# Patient Record
Sex: Female | Born: 1960
Health system: Southern US, Community
[De-identification: ages and names within clinical notes are randomized; demographics above are authoritative.]

## PROBLEM LIST (undated history)

## (undated) DIAGNOSIS — T8859XA Other complications of anesthesia, initial encounter: Secondary | ICD-10-CM

## (undated) DIAGNOSIS — R002 Palpitations: Secondary | ICD-10-CM

## (undated) DIAGNOSIS — R7303 Prediabetes: Secondary | ICD-10-CM

## (undated) DIAGNOSIS — K219 Gastro-esophageal reflux disease without esophagitis: Secondary | ICD-10-CM

## (undated) DIAGNOSIS — Z72821 Inadequate sleep hygiene: Secondary | ICD-10-CM

## (undated) DIAGNOSIS — T7840XA Allergy, unspecified, initial encounter: Secondary | ICD-10-CM

## (undated) DIAGNOSIS — I739 Peripheral vascular disease, unspecified: Secondary | ICD-10-CM

## (undated) DIAGNOSIS — I10 Essential (primary) hypertension: Secondary | ICD-10-CM

## (undated) HISTORY — DX: Peripheral vascular disease, unspecified: I73.9

## (undated) HISTORY — DX: Palpitations: R00.2

## (undated) HISTORY — PX: DENTAL SURGERY: SHX609

## (undated) HISTORY — DX: Inadequate sleep hygiene: Z72.821

## (undated) HISTORY — DX: Gastro-esophageal reflux disease without esophagitis: K21.9

## (undated) HISTORY — DX: Allergy, unspecified, initial encounter: T78.40XA

## (undated) HISTORY — DX: Essential (primary) hypertension: I10

## (undated) HISTORY — PX: EYE SURGERY: SHX253

---
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
## 1998-01-01 ENCOUNTER — Other Ambulatory Visit: Admission: RE | Admit: 1998-01-01 | Discharge: 1998-01-01 | Payer: Self-pay | Admitting: *Deleted

## 1999-08-11 ENCOUNTER — Other Ambulatory Visit: Admission: RE | Admit: 1999-08-11 | Discharge: 1999-08-11 | Payer: Self-pay | Admitting: *Deleted

## 2000-10-24 ENCOUNTER — Other Ambulatory Visit: Admission: RE | Admit: 2000-10-24 | Discharge: 2000-10-24 | Payer: Self-pay | Admitting: *Deleted

## 2001-09-25 ENCOUNTER — Emergency Department (HOSPITAL_COMMUNITY): Admission: EM | Admit: 2001-09-25 | Discharge: 2001-09-25 | Payer: Self-pay

## 2001-11-01 ENCOUNTER — Other Ambulatory Visit: Admission: RE | Admit: 2001-11-01 | Discharge: 2001-11-01 | Payer: Self-pay | Admitting: *Deleted

## 2003-01-10 ENCOUNTER — Other Ambulatory Visit: Admission: RE | Admit: 2003-01-10 | Discharge: 2003-01-10 | Payer: Self-pay | Admitting: *Deleted

## 2003-06-03 ENCOUNTER — Ambulatory Visit (HOSPITAL_COMMUNITY): Admission: RE | Admit: 2003-06-03 | Discharge: 2003-06-03 | Payer: Self-pay | Admitting: Internal Medicine

## 2004-04-28 ENCOUNTER — Other Ambulatory Visit: Admission: RE | Admit: 2004-04-28 | Discharge: 2004-04-28 | Payer: Self-pay | Admitting: Obstetrics & Gynecology

## 2007-05-02 ENCOUNTER — Ambulatory Visit (HOSPITAL_COMMUNITY): Admission: AD | Admit: 2007-05-02 | Discharge: 2007-05-02 | Payer: Self-pay | Admitting: Internal Medicine

## 2010-10-01 ENCOUNTER — Other Ambulatory Visit (HOSPITAL_COMMUNITY): Payer: Self-pay | Admitting: Internal Medicine

## 2010-10-01 DIAGNOSIS — Z1231 Encounter for screening mammogram for malignant neoplasm of breast: Secondary | ICD-10-CM

## 2010-10-01 DIAGNOSIS — N951 Menopausal and female climacteric states: Secondary | ICD-10-CM

## 2010-10-16 ENCOUNTER — Other Ambulatory Visit (HOSPITAL_COMMUNITY): Payer: Self-pay

## 2010-10-16 ENCOUNTER — Ambulatory Visit (HOSPITAL_COMMUNITY): Payer: Private Health Insurance - Indemnity

## 2010-11-06 ENCOUNTER — Ambulatory Visit (HOSPITAL_COMMUNITY)
Admission: RE | Admit: 2010-11-06 | Discharge: 2010-11-06 | Disposition: A | Discharge status: Home / Self Care | Payer: Private Health Insurance - Indemnity | Source: Ambulatory Visit | Attending: Internal Medicine | Admitting: Internal Medicine

## 2010-11-06 DIAGNOSIS — N951 Menopausal and female climacteric states: Secondary | ICD-10-CM

## 2010-11-06 DIAGNOSIS — Z1231 Encounter for screening mammogram for malignant neoplasm of breast: Secondary | ICD-10-CM

## 2010-11-11 ENCOUNTER — Other Ambulatory Visit (HOSPITAL_COMMUNITY): Payer: Self-pay | Admitting: Internal Medicine

## 2010-11-11 DIAGNOSIS — N6489 Other specified disorders of breast: Secondary | ICD-10-CM

## 2010-11-18 ENCOUNTER — Ambulatory Visit
Admission: RE | Admit: 2010-11-18 | Discharge: 2010-11-18 | Disposition: A | Discharge status: Home / Self Care | Payer: Private Health Insurance - Indemnity | Source: Ambulatory Visit | Attending: Internal Medicine | Admitting: Internal Medicine

## 2010-11-18 DIAGNOSIS — N6489 Other specified disorders of breast: Secondary | ICD-10-CM

## 2013-03-22 ENCOUNTER — Encounter: Payer: Self-pay | Admitting: Internal Medicine

## 2013-03-29 ENCOUNTER — Ambulatory Visit (INDEPENDENT_AMBULATORY_CARE_PROVIDER_SITE_OTHER): Payer: Managed Care, Other (non HMO) | Admitting: Internal Medicine

## 2013-03-29 ENCOUNTER — Encounter: Payer: Self-pay | Admitting: Internal Medicine

## 2013-03-29 VITALS — BP 116/68 | HR 64 | Temp 98.6°F | Resp 18 | Wt 107.4 lb

## 2013-03-29 DIAGNOSIS — R5381 Other malaise: Secondary | ICD-10-CM

## 2013-03-29 DIAGNOSIS — J0100 Acute maxillary sinusitis, unspecified: Secondary | ICD-10-CM

## 2013-03-29 DIAGNOSIS — N760 Acute vaginitis: Secondary | ICD-10-CM

## 2013-03-29 DIAGNOSIS — J019 Acute sinusitis, unspecified: Secondary | ICD-10-CM

## 2013-03-29 DIAGNOSIS — E559 Vitamin D deficiency, unspecified: Secondary | ICD-10-CM | POA: Insufficient documentation

## 2013-03-29 DIAGNOSIS — L709 Acne, unspecified: Secondary | ICD-10-CM

## 2013-03-29 DIAGNOSIS — R002 Palpitations: Secondary | ICD-10-CM | POA: Insufficient documentation

## 2013-03-29 LAB — CBC WITH DIFFERENTIAL/PLATELET
Eosinophils Absolute: 0 10*3/uL (ref 0.0–0.7)
Hemoglobin: 14.3 g/dL (ref 12.0–15.0)
Lymphocytes Relative: 19 % (ref 12–46)
Lymphs Abs: 1.5 10*3/uL (ref 0.7–4.0)
MCH: 31.2 pg (ref 26.0–34.0)
Neutro Abs: 5.9 10*3/uL (ref 1.7–7.7)
Neutrophils Relative %: 73 % (ref 43–77)
Platelets: 270 10*3/uL (ref 150–400)
RBC: 4.59 MIL/uL (ref 3.87–5.11)
WBC: 8 10*3/uL (ref 4.0–10.5)

## 2013-03-29 LAB — IRON AND TIBC
%SAT: 40 % (ref 20–55)
TIBC: 342 ug/dL (ref 250–470)

## 2013-03-29 MED ORDER — FLUCONAZOLE 150 MG PO TABS: ORAL_TABLET | ORAL | Status: DC

## 2013-03-29 MED ORDER — TRETINOIN 0.1 % EX CREA: TOPICAL_CREAM | Freq: Every day | CUTANEOUS | Status: DC

## 2013-03-29 MED ORDER — DOXYCYCLINE HYCLATE 100 MG PO CAPS: ORAL_CAPSULE | ORAL | Status: DC

## 2013-03-29 MED ORDER — OXAZEPAM 30 MG PO CAPS: 30.0000 mg | ORAL_CAPSULE | Freq: Every evening | ORAL | Status: DC | PRN

## 2013-03-29 NOTE — Progress Notes (Signed)
Patient ID: Leah Patrick, female   DOB: 04-09-1961, 52 y.o.   MRN: [id number]   This very nice 52 yo WWF presents for 6 month follow up with           Vitamin D Deficiency.    Today's BP is 116/68. She does have remote Hx of palpitations and  denies any cardiac type chest pain, recent palpitations, dyspnea/orthopnea/PND, dizziness, claudication, or dependent edema.   Last week her dentist Dx'd her with sinusitis by her dental X-rays and treated her with a Zpak  to follow with an Augmentin Rx. She still has persistent Sx's of Maxillary sinus pressure, pain and drainage.   Also she c/o's  hair loss and thinning. No weight changes. Systems Review is generally negative as below.   Further, Patient has history of Vitamin D Deficiency with last vitamin D of 68. Patient supplements vitamin D without any suspected side-effects.  Current Outpatient Prescriptions on File Prior to Visit  Medication Sig Dispense Refill  . ALPRAZolam (XANAX) 0.25 MG tablet Take 0.25 mg by mouth at bedtime as needed for anxiety.      Marland Kitchen aspirin 81 MG tablet Take 81 mg by mouth daily.      Marland Kitchen buPROPion (WELLBUTRIN XL) 150 MG 24 hr tablet Take 150 mg by mouth daily.      . cholecalciferol (VITAMIN D) 1000 UNITS tablet Take 1,000 Units by mouth daily.      . Estradiol 0.25 MG/0.25GM GEL Place onto the skin.      Marland Kitchen propranolol (INDERAL) 40 MG tablet Take 40 mg by mouth daily as needed.       No current facility-administered medications on file prior to visit.     Allergies  Allergen Reactions  . Dtap-Ipv Vaccine   . Flexeril [Cyclobenzaprine]     PMHx:   Past Medical History  Diagnosis Date  . Hypertension   . GERD (gastroesophageal reflux disease)   . Palpitations   . Poor sleep hygiene     FHx:    Reviewed / unchanged  SHx:    Reviewed / unchanged  Systems Review: Constitutional: Denies fever, chills, wt changes, headaches, insomnia, fatigue, night sweats, change in appetite. Eyes: Denies redness, blurred  vision, diplopia, discharge, itchy, watery eyes.  ENT: Deniesepistaxis, sore throat, earache, hearing loss, dental pain, tinnitus, vertigo or snoring. C/o post nasal drainage sinus congestion & pain CV: Denies chest pain, palpitations, irregular heartbeat, syncope, dyspnea, diaphoresis, orthopnea, PND, claudication, edema. Respiratory: denies cough, dyspnea, DOE, pleurisy, hoarseness, laryngitis, wheezing.  Gastrointestinal: Denies dysphagia, odynophagia, heartburn, reflux, water brash, abdominal pain or cramps, nausea, vomiting, bloating, diarrhea, constipation, hematemesis, melena, hematochezia,  or hemorrhoids. Genitourinary: Denies dysuria, frequency, urgency, nocturia, hesitancy, discharge, hematuria, flank pain. Musculoskeletal: Denies arthralgias, myalgias, stiffness, jt. swelling, pain, limp, strain/sprain.  Skin: Denies pruritus, rash, hives, warts, acne, eczema, change in skin lesion(s). Neuro: No weakness, tremor, incoordination, spasms, paresthesia, or pain. Psychiatric: Denies confusion, memory loss, or sensory loss. Endo: Denies change in weight, skin, hair change.  Heme/Lymph: No excessive bleeding, bruising, orenlarged lymph nodes.  Filed Vitals:   03/29/13 1022  BP: 116/68  Pulse: 64  Temp: 98.6 F (37 C)  Resp: 18    On Exam:  Appears well nourished - in no distress. Eyes: PERRLA, EOMs, conjunctiva no swelling or erythema. Sinuses: Bilat maxillary tenderness. ENT/Mouth: EAC's clear, TM's nl w/o erythema, bulging. Nares clear w/o erythema, swelling, exudates. Oropharynx clear without erythema or exudates. Oral hygiene is good. Tongue normal, non obstructing.  Hearing intact.  Neck: Supple. Thyroid nl. Car 2+/2+ without bruits, nodes or JVD. Chest: Respirations nl with BS clear & equal w/o rales, rhonchi, wheezing or stridor.  Cor: Heart sounds normal w/ regular rate and rhythm without sig. murmurs, gallops, clicks, or rubs. Peripheral pulses normal and equal  without  edema.  Abdomen: Soft & bowel sounds normal. Non-tender w/o guarding, rebound, hernias, masses, or organomegaly.   Musculoskeletal: Full ROM all peripheral extremities, joint stability, 5/5 strength, and normal gait.  Skin: Warm, dry without exposed rashes, lesions, ecchymosis apparent.  Neuro: Cranial nerves intact, reflexes equal bilaterally. Sensory-motor testing grossly intact. Tendon reflexes grossly intact.  Pysch: Alert & oriented x 3. Insight and judgement nl & appropriate. No ideations.  Assessment and Plan:  1. Sinusitis- finish Augmentin - given Rx Diflucan to use prn as needed  2. Fatigue   4. Vitamin D Deficiency - Continue supplementation.  Recommended regular exercise,and discussed med and SE's. Recommended labs to assess and monitor clinical status. Further disposition pending results of labs.

## 2013-03-29 NOTE — Patient Instructions (Signed)
Sinusitis Sinusitis is redness, soreness, and swelling (inflammation) of the paranasal sinuses. Paranasal sinuses are air pockets within the bones of your face (beneath the eyes, the middle of the forehead, or above the eyes). In healthy paranasal sinuses, mucus is able to drain out, and air is able to circulate through them by way of your nose. However, when your paranasal sinuses are inflamed, mucus and air can become trapped. This can allow bacteria and other germs to grow and cause infection. Sinusitis can develop quickly and last only a short time (acute) or continue over a long period (chronic). Sinusitis that lasts for more than 12 weeks is considered chronic.  CAUSES  Causes of sinusitis include:  Allergies.  Structural abnormalities, such as displacement of the cartilage that separates your nostrils (deviated septum), which can decrease the air flow through your nose and sinuses and affect sinus drainage.  Functional abnormalities, such as when the small hairs (cilia) that line your sinuses and help remove mucus do not work properly or are not present. SYMPTOMS  Symptoms of acute and chronic sinusitis are the same. The primary symptoms are pain and pressure around the affected sinuses. Other symptoms include:  Upper toothache.  Earache.  Headache.  Bad breath.  Decreased sense of smell and taste.  A cough, which worsens when you are lying flat.  Fatigue.  Fever.  Thick drainage from your nose, which often is green and may contain pus (purulent).  Swelling and warmth over the affected sinuses. DIAGNOSIS  Your caregiver will perform a physical exam. During the exam, your caregiver may:  Look in your nose for signs of abnormal growths in your nostrils (nasal polyps).  Tap over the affected sinus to check for signs of infection.  View the inside of your sinuses (endoscopy) with a special imaging device with a light attached (endoscope), which is inserted into your  sinuses. If your caregiver suspects that you have chronic sinusitis, one or more of the following tests may be recommended:  Allergy tests.  Nasal culture A sample of mucus is taken from your nose and sent to a lab and screened for bacteria.  Nasal cytology A sample of mucus is taken from your nose and examined by your caregiver to determine if your sinusitis is related to an allergy. TREATMENT  Most cases of acute sinusitis are related to a viral infection and will resolve on their own within 10 days. Sometimes medicines are prescribed to help relieve symptoms (pain medicine, decongestants, nasal steroid sprays, or saline sprays).  However, for sinusitis related to a bacterial infection, your caregiver will prescribe antibiotic medicines. These are medicines that will help kill the bacteria causing the infection.  Rarely, sinusitis is caused by a fungal infection. In theses cases, your caregiver will prescribe antifungal medicine. For some cases of chronic sinusitis, surgery is needed. Generally, these are cases in which sinusitis recurs more than 3 times per year, despite other treatments. HOME CARE INSTRUCTIONS   Drink plenty of water. Water helps thin the mucus so your sinuses can drain more easily.  Use a humidifier.  Inhale steam 3 to 4 times a day (for example, sit in the bathroom with the shower running).  Apply a warm, moist washcloth to your face 3 to 4 times a day, or as directed by your caregiver.  Use saline nasal sprays to help moisten and clean your sinuses.  Take over-the-counter or prescription medicines for pain, discomfort, or fever only as directed by your caregiver. SEEK IMMEDIATE MEDICAL   CARE IF:  You have increasing pain or severe headaches.  You have nausea, vomiting, or drowsiness.  You have swelling around your face.  You have vision problems.  You have a stiff neck.  You have difficulty breathing. MAKE SURE YOU:   Understand these  instructions.  Will watch your condition.  Will get help right away if you are not doing well or get worse. Document Released: 04/05/2005 Document Revised: 06/28/2011 Document Reviewed: 04/20/2011 [facility] Patient Information 2014 [place], [place]. Vitamin D Deficiency Vitamin D is an important vitamin that your body needs. Having too little of it in your body is called a deficiency. A very bad deficiency can make your bones soft and can cause a condition called rickets.  Vitamin D is important to your body for different reasons, such as:   It helps your body absorb 2 minerals called calcium and phosphorus.  It helps make your bones healthy.  It may prevent some diseases, such as diabetes and multiple sclerosis.  It helps your muscles and heart. You can get vitamin D in several ways. It is a natural part of some foods. The vitamin is also added to some dairy products and cereals. Some people take vitamin D supplements. Also, your body makes vitamin D when you are in the sun. It changes the sun's rays into a form of the vitamin that your body can use. CAUSES   Not eating enough foods that contain vitamin D.  Not getting enough sunlight.  Having certain digestive system diseases that make it hard to absorb vitamin D. These diseases include Crohn's disease, chronic pancreatitis, and cystic fibrosis.  Having a surgery in which part of the stomach or small intestine is removed.  Being obese. Fat cells pull vitamin D out of your blood. That means that obese people may not have enough vitamin D left in their blood and in other body tissues.  Having chronic kidney or liver disease. RISK FACTORS Risk factors are things that make you more likely to develop a vitamin D deficiency. They include:  Being older.  Not being able to get outside very much.  Living in a nursing home.  Having had broken bones.  Having weak or thin bones (osteoporosis).  Having a disease or condition that  changes how your body absorbs vitamin D.  Having dark skin.  Some medicines such as seizure medicines or steroids.  Being overweight or obese. SYMPTOMS Mild cases of vitamin D deficiency may not have any symptoms. If you have a very bad case, symptoms may include:  Bone pain.  Muscle pain.  Falling often.  Broken bones caused by a minor injury, due to osteoporosis. DIAGNOSIS A blood test is the best way to tell if you have a vitamin D deficiency. TREATMENT Vitamin D deficiency can be treated in different ways. Treatment for vitamin D deficiency depends on what is causing it. Options include:  Taking vitamin D supplements.  Taking a calcium supplement. Your caregiver will suggest what dose is best for you. HOME CARE INSTRUCTIONS  Take any supplements that your caregiver prescribes. Follow the directions carefully. Take only the suggested amount.  Have your blood tested 2 months after you start taking supplements.  Eat foods that contain vitamin D. Healthy choices include:  Fortified dairy products, cereals, or juices. Fortified means vitamin D has been added to the food. Check the label on the package to be sure.  Fatty fish like salmon or trout.  Eggs.  Oysters.  Do not use a  tanning bed.  Keep your weight at a healthy level. Lose weight if you need to.  Keep all follow-up appointments. Your caregiver will need to perform blood tests to make sure your vitamin D deficiency is going away. SEEK MEDICAL CARE IF:  You have any questions about your treatment.  You continue to have symptoms of vitamin D deficiency.  You have nausea or vomiting.  You are constipated.  You feel confused.  You have severe abdominal or back pain. MAKE SURE YOU:  Understand these instructions.  Will watch your condition.  Will get help right away if you are not doing well or get worse. Document Released: 06/28/2011 Document Revised: 07/31/2012 Document Reviewed:  06/28/2011 [facility] Patient Information 2014 [place], [place].

## 2013-03-30 LAB — VITAMIN D 25 HYDROXY (VIT D DEFICIENCY, FRACTURES): Vit D, 25-Hydroxy: 71 ng/mL (ref 30–89)

## 2013-04-09 ENCOUNTER — Other Ambulatory Visit: Payer: Self-pay | Admitting: Internal Medicine

## 2013-04-09 DIAGNOSIS — J019 Acute sinusitis, unspecified: Secondary | ICD-10-CM

## 2013-04-09 MED ORDER — LEVOFLOXACIN 500 MG PO TABS
500.0000 mg | ORAL_TABLET | Freq: Every day | ORAL | Status: AC
Start: 2013-04-09 — End: 2013-04-19

## 2013-04-09 MED ORDER — HYDROCODONE-ACETAMINOPHEN 5-325 MG PO TABS: 1.0000 | ORAL_TABLET | Freq: Four times a day (QID) | ORAL | Status: DC | PRN

## 2013-10-03 ENCOUNTER — Encounter: Payer: Self-pay | Admitting: Internal Medicine

## 2013-11-19 ENCOUNTER — Encounter: Payer: Self-pay | Admitting: Internal Medicine

## 2013-11-19 ENCOUNTER — Ambulatory Visit (INDEPENDENT_AMBULATORY_CARE_PROVIDER_SITE_OTHER): Payer: BC Managed Care – PPO | Admitting: Internal Medicine

## 2013-11-19 VITALS — BP 116/80 | HR 72 | Temp 99.1°F | Resp 16 | Ht 61.0 in | Wt 112.8 lb

## 2013-11-19 DIAGNOSIS — Z79899 Other long term (current) drug therapy: Secondary | ICD-10-CM | POA: Insufficient documentation

## 2013-11-19 DIAGNOSIS — R740 Nonspecific elevation of levels of transaminase and lactic acid dehydrogenase [LDH]: Secondary | ICD-10-CM

## 2013-11-19 DIAGNOSIS — Z1212 Encounter for screening for malignant neoplasm of rectum: Secondary | ICD-10-CM

## 2013-11-19 DIAGNOSIS — E559 Vitamin D deficiency, unspecified: Secondary | ICD-10-CM

## 2013-11-19 DIAGNOSIS — I10 Essential (primary) hypertension: Secondary | ICD-10-CM | POA: Insufficient documentation

## 2013-11-19 DIAGNOSIS — Z0000 Encounter for general adult medical examination without abnormal findings: Secondary | ICD-10-CM

## 2013-11-19 DIAGNOSIS — Z111 Encounter for screening for respiratory tuberculosis: Secondary | ICD-10-CM

## 2013-11-19 DIAGNOSIS — Z113 Encounter for screening for infections with a predominantly sexual mode of transmission: Secondary | ICD-10-CM

## 2013-11-19 DIAGNOSIS — R7402 Elevation of levels of lactic acid dehydrogenase (LDH): Secondary | ICD-10-CM

## 2013-11-19 DIAGNOSIS — R7401 Elevation of levels of liver transaminase levels: Secondary | ICD-10-CM

## 2013-11-19 MED ORDER — ALPRAZOLAM 1 MG PO TABS: 1.0000 mg | ORAL_TABLET | Freq: Three times a day (TID) | ORAL | Status: DC | PRN

## 2013-11-19 NOTE — Patient Instructions (Addendum)
Recommend the book "The END of DIETING" by Dr Baker Janus   and the book "The END of DIABETES " by Dr Excell Seltzer  At [email or web address] - get book & Audio CD's      Being diabetic has a  300% increased risk for heart attack, stroke, cancer, and alzheimer- type vascular dementia. It is very important that you work harder with diet by avoiding all foods that are white except chicken & fish. Avoid white rice (brown & wild rice is OK), white potatoes (sweetpotatoes in moderation is OK), White bread or wheat bread or anything made out of white flour like bagels, donuts, rolls, buns, biscuits, cakes, pastries, cookies, pizza crust, and pasta (made from white flour & egg whites) - vegetarian pasta or spinach or wheat pasta is OK. Multigrain breads like Arnold's or [employer], or multigrain sandwich thins or flatbreads.  Diet, exercise and weight loss can reverse and cure diabetes in the early stages.  Diet, exercise and weight loss is very important in the control and prevention of complications of diabetes which affects every system in your body, ie. Brain - dementia/stroke, eyes - glaucoma/blindness, heart - heart attack/heart failure, kidneys - dialysis, stomach - gastric paralysis, intestines - malabsorption, nerves - severe painful neuritis, circulation - gangrene & loss of a leg(s), and finally cancer and Alzheimers.    I recommend avoid fried & greasy foods,  sweets/candy, white rice (brown or wild rice or Quinoa is OK), white potatoes (sweet potatoes are OK) - anything made from white flour - bagels, doughnuts, rolls, buns, biscuits,white and wheat breads, pizza crust and traditional pasta made of white flour & egg white(vegetarian pasta or spinach or wheat pasta is OK).  Multi-grain bread is OK - like multi-grain flat bread or sandwich thins. Avoid alcohol in excess. Exercise is also important.    Eat all the vegetables you want - avoid meat, especially red meat and dairy - especially cheese.  Cheese  is the most concentrated form of trans-fats which is the worst thing to clog up our arteries. Veggie cheese is OK which can be found in the fresh produce section at [employer] or [employer] or [employer]  Preventive Care for Adults A healthy lifestyle and preventive care can promote health and wellness. Preventive health guidelines for women include the following key practices.  A routine yearly physical is a good way to check with your health care provider about your health and preventive screening. It is a chance to share any concerns and updates on your health and to receive a thorough exam.  Visit your dentist for a routine exam and preventive care every 6 months. Brush your teeth twice a day and floss once a day. Good oral hygiene prevents tooth decay and gum disease.  The frequency of eye exams is based on your age, health, family medical history, use of contact lenses, and other factors. Follow your health care provider's recommendations for frequency of eye exams.  Eat a healthy diet. Foods like vegetables, fruits, whole grains, low-fat dairy products, and lean protein foods contain the nutrients you need without too many calories. Decrease your intake of foods high in solid fats, added sugars, and salt. Eat the right amount of calories for you.Get information about a proper diet from your health care provider, if necessary.  Regular physical exercise is one of the most important things you can do for your health. Most adults should get at least 150 minutes of moderate-intensity exercise (any activity that increases  your heart rate and causes you to sweat) each week. In addition, most adults need muscle-strengthening exercises on 2 or more days a week.  Maintain a healthy weight. The body mass index (BMI) is a screening tool to identify possible weight problems. It provides an estimate of body fat based on height and weight. Your health care provider can find your BMI and can help you  achieve or maintain a healthy weight.For adults 20 years and older:  A BMI below 18.5 is considered underweight.  A BMI of 18.5 to 24.9 is normal.  A BMI of 25 to 29.9 is considered overweight.  A BMI of 30 and above is considered obese.  Maintain normal blood lipids and cholesterol levels by exercising and minimizing your intake of saturated fat. Eat a balanced diet with plenty of fruit and vegetables. Blood tests for lipids and cholesterol should begin at age 43 and be repeated every 5 years. If your lipid or cholesterol levels are high, you are over 50, or you are at high risk for heart disease, you may need your cholesterol levels checked more frequently.Ongoing high lipid and cholesterol levels should be treated with medicines if diet and exercise are not working.  If you smoke, find out from your health care provider how to quit. If you do not use tobacco, do not start.  Lung cancer screening is recommended for adults aged 40-80 years who are at high risk for developing lung cancer because of a history of smoking. A yearly low-dose CT scan of the lungs is recommended for people who have at least a 30-pack-year history of smoking and are a current smoker or have quit within the past 15 years. A pack year of smoking is smoking an average of 1 pack of cigarettes a day for 1 year (for example: 1 pack a day for 30 years or 2 packs a day for 15 years). Yearly screening should continue until the smoker has stopped smoking for at least 15 years. Yearly screening should be stopped for people who develop a health problem that would prevent them from having lung cancer treatment.  If you are pregnant, do not drink alcohol. If you are breastfeeding, be very cautious about drinking alcohol. If you are not pregnant and choose to drink alcohol, do not have more than 1 drink per day. One drink is considered to be 12 ounces (355 mL) of beer, 5 ounces (148 mL) of wine, or 1.5 ounces (44 mL) of liquor.  Avoid  use of street drugs. Do not share needles with anyone. Ask for help if you need support or instructions about stopping the use of drugs.  High blood pressure causes heart disease and increases the risk of stroke. Your blood pressure should be checked at least every 1 to 2 years. Ongoing high blood pressure should be treated with medicines if weight loss and exercise do not work.  If you are 74-43 years old, ask your health care provider if you should take aspirin to prevent strokes.  Diabetes screening involves taking a blood sample to check your fasting blood sugar level. This should be done once every 3 years, after age 105, if you are within normal weight and without risk factors for diabetes. Testing should be considered at a younger age or be carried out more frequently if you are overweight and have at least 1 risk factor for diabetes.  Breast cancer screening is essential preventive care for women. You should practice "breast self-awareness." This means understanding the  normal appearance and feel of your breasts and may include breast self-examination. Any changes detected, no matter how small, should be reported to a health care provider. Women in their 77s and 30s should have a clinical breast exam (CBE) by a health care provider as part of a regular health exam every 1 to 3 years. After age 52, women should have a CBE every year. Starting at age 59, women should consider having a mammogram (breast X-ray test) every year. Women who have a family history of breast cancer should talk to their health care provider about genetic screening. Women at a high risk of breast cancer should talk to their health care providers about having an MRI and a mammogram every year.  Breast cancer gene (BRCA)-related cancer risk assessment is recommended for women who have family members with BRCA-related cancers. BRCA-related cancers include breast, ovarian, tubal, and peritoneal cancers. Having family members with  these cancers may be associated with an increased risk for harmful changes (mutations) in the breast cancer genes BRCA1 and BRCA2. Results of the assessment will determine the need for genetic counseling and BRCA1 and BRCA2 testing.  Routine pelvic exams to screen for cancer are no longer recommended for nonpregnant women who are considered low risk for cancer of the pelvic organs (ovaries, uterus, and vagina) and who do not have symptoms. Ask your health care provider if a screening pelvic exam is right for you.  If you have had past treatment for cervical cancer or a condition that could lead to cancer, you need Pap tests and screening for cancer for at least 20 years after your treatment. If Pap tests have been discontinued, your risk factors (such as having a new sexual partner) need to be reassessed to determine if screening should be resumed. Some women have medical problems that increase the chance of getting cervical cancer. In these cases, your health care provider may recommend more frequent screening and Pap tests.  The HPV test is an additional test that may be used for cervical cancer screening. The HPV test looks for the virus that can cause the cell changes on the cervix. The cells collected during the Pap test can be tested for HPV. The HPV test could be used to screen women aged 43 years and older, and should be used in women of any age who have unclear Pap test results. After the age of 50, women should have HPV testing at the same frequency as a Pap test.  Colorectal cancer can be detected and often prevented. Most routine colorectal cancer screening begins at the age of 30 years and continues through age 66 years. However, your health care provider may recommend screening at an earlier age if you have risk factors for colon cancer. On a yearly basis, your health care provider may provide home test kits to check for hidden blood in the stool. Use of a small camera at the end of a tube, to  directly examine the colon (sigmoidoscopy or colonoscopy), can detect the earliest forms of colorectal cancer. Talk to your health care provider about this at age 43, when routine screening begins. Direct exam of the colon should be repeated every 5-10 years through age 56 years, unless early forms of pre-cancerous polyps or small growths are found.  People who are at an increased risk for hepatitis B should be screened for this virus. You are considered at high risk for hepatitis B if:  You were born in a country where hepatitis B occurs  often. Talk with your health care provider about which countries are considered high risk.  Your parents were born in a high-risk country and you have not received a shot to protect against hepatitis B (hepatitis B vaccine).  You have HIV or AIDS.  You use needles to inject street drugs.  You live with, or have sex with, someone who has hepatitis B.  You get hemodialysis treatment.  You take certain medicines for conditions like cancer, organ transplantation, and autoimmune conditions.  Hepatitis C blood testing is recommended for all people born from [birth year] through 1965 and any individual with known risks for hepatitis C.  Practice safe sex. Use condoms and avoid high-risk sexual practices to reduce the spread of sexually transmitted infections (STIs). STIs include gonorrhea, chlamydia, syphilis, trichomonas, herpes, HPV, and human immunodeficiency virus (HIV). Herpes, HIV, and HPV are viral illnesses that have no cure. They can result in disability, cancer, and death.  You should be screened for sexually transmitted illnesses (STIs) including gonorrhea and chlamydia if:  You are sexually active and are younger than 24 years.  You are older than 24 years and your health care provider tells you that you are at risk for this type of infection.  Your sexual activity has changed since you were last screened and you are at an increased risk for chlamydia or  gonorrhea. Ask your health care provider if you are at risk.  If you are at risk of being infected with HIV, it is recommended that you take a prescription medicine daily to prevent HIV infection. This is called preexposure prophylaxis (PrEP). You are considered at risk if:  You are a heterosexual woman, are sexually active, and are at increased risk for HIV infection.  You take drugs by injection.  You are sexually active with a partner who has HIV.  Talk with your health care provider about whether you are at high risk of being infected with HIV. If you choose to begin PrEP, you should first be tested for HIV. You should then be tested every 3 months for as long as you are taking PrEP.  Osteoporosis is a disease in which the bones lose minerals and strength with aging. This can result in serious bone fractures or breaks. The risk of osteoporosis can be identified using a bone density scan. Women ages 35 years and over and women at risk for fractures or osteoporosis should discuss screening with their health care providers. Ask your health care provider whether you should take a calcium supplement or vitamin D to reduce the rate of osteoporosis.  Menopause can be associated with physical symptoms and risks. Hormone replacement therapy is available to decrease symptoms and risks. You should talk to your health care provider about whether hormone replacement therapy is right for you.  Use sunscreen. Apply sunscreen liberally and repeatedly throughout the day. You should seek shade when your shadow is shorter than you. Protect yourself by wearing long sleeves, pants, a wide-brimmed hat, and sunglasses year round, whenever you are outdoors.  Once a month, do a whole body skin exam, using a mirror to look at the skin on your back. Tell your health care provider of new moles, moles that have irregular borders, moles that are larger than a pencil eraser, or moles that have changed in shape or  color.  Stay current with required vaccines (immunizations).  Influenza vaccine. All adults should be immunized every year.  Tetanus, diphtheria, and acellular pertussis (Td, Tdap) vaccine. Pregnant women should receive  1 dose of Tdap vaccine during each pregnancy. The dose should be obtained regardless of the length of time since the last dose. Immunization is preferred during the 27th-36th week of gestation. An adult who has not previously received Tdap or who does not know her vaccine status should receive 1 dose of Tdap. This initial dose should be followed by tetanus and diphtheria toxoids (Td) booster doses every 10 years. Adults with an unknown or incomplete history of completing a 3-dose immunization series with Td-containing vaccines should begin or complete a primary immunization series including a Tdap dose. Adults should receive a Td booster every 10 years.  Varicella vaccine. An adult without evidence of immunity to varicella should receive 2 doses or a second dose if she has previously received 1 dose. Pregnant females who do not have evidence of immunity should receive the first dose after pregnancy. This first dose should be obtained before leaving the health care facility. The second dose should be obtained 4-8 weeks after the first dose.  Human papillomavirus (HPV) vaccine. Females aged 13-26 years who have not received the vaccine previously should obtain the 3-dose series. The vaccine is not recommended for use in pregnant females. However, pregnancy testing is not needed before receiving a dose. If a female is found to be pregnant after receiving a dose, no treatment is needed. In that case, the remaining doses should be delayed until after the pregnancy. Immunization is recommended for any person with an immunocompromised condition through the age of 32 years if she did not get any or all doses earlier. During the 3-dose series, the second dose should be obtained 4-8 weeks after the  first dose. The third dose should be obtained 24 weeks after the first dose and 16 weeks after the second dose.  Zoster vaccine. One dose is recommended for adults aged 26 years or older unless certain conditions are present.  Measles, mumps, and rubella (MMR) vaccine. Adults born before [birth year] generally are considered immune to measles and mumps. Adults born in [birth year] or later should have 1 or more doses of MMR vaccine unless there is a contraindication to the vaccine or there is laboratory evidence of immunity to each of the three diseases. A routine second dose of MMR vaccine should be obtained at least 28 days after the first dose for students attending postsecondary schools, health care workers, or international travelers. People who received inactivated measles vaccine or an unknown type of measles vaccine during 1963-1967 should receive 2 doses of MMR vaccine. People who received inactivated mumps vaccine or an unknown type of mumps vaccine before 1979 and are at high risk for mumps infection should consider immunization with 2 doses of MMR vaccine. For females of childbearing age, rubella immunity should be determined. If there is no evidence of immunity, females who are not pregnant should be vaccinated. If there is no evidence of immunity, females who are pregnant should delay immunization until after pregnancy. Unvaccinated health care workers born before [birth year] who lack laboratory evidence of measles, mumps, or rubella immunity or laboratory confirmation of disease should consider measles and mumps immunization with 2 doses of MMR vaccine or rubella immunization with 1 dose of MMR vaccine.  Pneumococcal 13-valent conjugate (PCV13) vaccine. When indicated, a person who is uncertain of her immunization history and has no record of immunization should receive the PCV13 vaccine. An adult aged 47 years or older who has certain medical conditions and has not been previously immunized should receive 1 dose of  PCV13 vaccine. This PCV13 should be followed with a dose of pneumococcal polysaccharide (PPSV23) vaccine. The PPSV23 vaccine dose should be obtained at least 8 weeks after the dose of PCV13 vaccine. An adult aged 66 years or older who has certain medical conditions and previously received 1 or more doses of PPSV23 vaccine should receive 1 dose of PCV13. The PCV13 vaccine dose should be obtained 1 or more years after the last PPSV23 vaccine dose.  Pneumococcal polysaccharide (PPSV23) vaccine. When PCV13 is also indicated, PCV13 should be obtained first. All adults aged 41 years and older should be immunized. An adult younger than age 20 years who has certain medical conditions should be immunized. Any person who resides in a nursing home or long-term care facility should be immunized. An adult smoker should be immunized. People with an immunocompromised condition and certain other conditions should receive both PCV13 and PPSV23 vaccines. People with human immunodeficiency virus (HIV) infection should be immunized as soon as possible after diagnosis. Immunization during chemotherapy or radiation therapy should be avoided. Routine use of PPSV23 vaccine is not recommended for American Indians, [place] Natives, or people younger than 65 years unless there are medical conditions that require PPSV23 vaccine. When indicated, people who have unknown immunization and have no record of immunization should receive PPSV23 vaccine. One-time revaccination 5 years after the first dose of PPSV23 is recommended for people aged 19-64 years who have chronic kidney failure, nephrotic syndrome, asplenia, or immunocompromised conditions. People who received 1-2 doses of PPSV23 before age 33 years should receive another dose of PPSV23 vaccine at age 19 years or later if at least 5 years have passed since the previous dose. Doses of PPSV23 are not needed for people immunized with PPSV23 at or after age 15 years.  Meningococcal vaccine.  Adults with asplenia or persistent complement component deficiencies should receive 2 doses of quadrivalent meningococcal conjugate (MenACWY-D) vaccine. The doses should be obtained at least 2 months apart. Microbiologists working with certain meningococcal bacteria, [place] [place] recruits, people at risk during an outbreak, and people who travel to or live in countries with a high rate of meningitis should be immunized. A first-year college student up through age 52 years who is living in a residence hall should receive a dose if she did not receive a dose on or after her 16th birthday. Adults who have certain high-risk conditions should receive one or more doses of vaccine.  Hepatitis A vaccine. Adults who wish to be protected from this disease, have certain high-risk conditions, work with hepatitis A-infected animals, work in hepatitis A research labs, or travel to or work in countries with a high rate of hepatitis A should be immunized. Adults who were previously unvaccinated and who anticipate close contact with an international adoptee during the first 60 days after arrival in the [place] from a country with a high rate of hepatitis A should be immunized.  Hepatitis B vaccine. Adults who wish to be protected from this disease, have certain high-risk conditions, may be exposed to blood or other infectious body fluids, are household contacts or sex partners of hepatitis B positive people, are clients or workers in certain care facilities, or travel to or work in countries with a high rate of hepatitis B should be immunized.  Haemophilus influenzae type b (Hib) vaccine. A previously unvaccinated person with asplenia or sickle cell disease or having a scheduled splenectomy should receive 1 dose of Hib vaccine. Regardless of previous immunization, a recipient of a hematopoietic stem  cell transplant should receive a 3-dose series 6-12 months after her successful transplant. Hib vaccine is not recommended for  adults with HIV infection. Preventive Services / Frequency  Ages 40 to 64 years  Blood pressure check.** / Every 1 to 2 years.  Lipid and cholesterol check.** / Every 5 years beginning at age 20 years.  Lung cancer screening. / Every year if you are aged 55-80 years and have a 30-pack-year history of smoking and currently smoke or have quit within the past 15 years. Yearly screening is stopped once you have quit smoking for at least 15 years or develop a health problem that would prevent you from having lung cancer treatment.  Clinical breast exam.** / Every year after age 40 years.  BRCA-related cancer risk assessment.** / For women who have family members with a BRCA-related cancer (breast, ovarian, tubal, or peritoneal cancers).  Mammogram.** / Every year beginning at age 40 years and continuing for as long as you are in good health. Consult with your health care provider.  Pap test.** / Every 3 years starting at age 30 years through age 65 or 70 years with a history of 3 consecutive normal Pap tests.  HPV screening.** / Every 3 years from ages 30 years through ages 65 to 70 years with a history of 3 consecutive normal Pap tests.  Fecal occult blood test (FOBT) of stool. / Every year beginning at age 50 years and continuing until age 75 years. You may not need to do this test if you get a colonoscopy every 10 years.  Flexible sigmoidoscopy or colonoscopy.** / Every 5 years for a flexible sigmoidoscopy or every 10 years for a colonoscopy beginning at age 50 years and continuing until age 75 years.  Hepatitis C blood test.** / For all people born from 1945 through 1965 and any individual with known risks for hepatitis C.  Skin self-exam. / Monthly.  Influenza vaccine. / Every year.  Tetanus, diphtheria, and acellular pertussis (Tdap/Td) vaccine.** / Consult your health care provider. Pregnant women should receive 1 dose of Tdap vaccine during each pregnancy. 1 dose of Td every 10  years.  Varicella vaccine.** / Consult your health care provider. Pregnant females who do not have evidence of immunity should receive the first dose after pregnancy.  Zoster vaccine.** / 1 dose for adults aged 60 years or older.  Measles, mumps, rubella (MMR) vaccine.** / You need at least 1 dose of MMR if you were born in 1957 or later. You may also need a 2nd dose. For females of childbearing age, rubella immunity should be determined. If there is no evidence of immunity, females who are not pregnant should be vaccinated. If there is no evidence of immunity, females who are pregnant should delay immunization until after pregnancy.  Pneumococcal 13-valent conjugate (PCV13) vaccine.** / Consult your health care provider.  Pneumococcal polysaccharide (PPSV23) vaccine.** / 1 to 2 doses if you smoke cigarettes or if you have certain conditions.  Meningococcal vaccine.** / Consult your health care provider.  Hepatitis A vaccine.** / Consult your health care provider.  Hepatitis B vaccine.** / Consult your health care provider.  Haemophilus influenzae type b (Hib) vaccine.** / Consult your health care provider.  

## 2013-11-19 NOTE — Progress Notes (Signed)
Patient ID: Leah Patrick, female   DOB: 08/05/1960, 53 y.o.   MRN: [id number]   Annual Screening Comprehensive Examination  This very nice 53 y.o.WWF presents for complete physical.  Patient has been followed for remote Hx/o HTN, palpitations, GERD and Vitamin D Deficiency. Patient has annual Breast Exam and Pap smear coming up soon with Juanda Chance, FNP.    Patient had one episode of labile HTN predates since 2005 and has monitored expectantly over the last 10 years with BP's remaining normal. Patient's BP has been controlled  and patient denies any cardiac symptoms as chest pain, palpitations, shortness of breath, dizziness or ankle swelling. Today's BP: 116/80 mmHg.     Last lipids were normal in June 2014 with Chol 161, TG 40, HDL 84 and LDL 69.   Patient has been screened for prediabetes predating and A1c and insulin levels have been normal. Patient denies reactive hypoglycemic symptoms, visual blurring, diabetic polys, or paresthesias.     Finally, patient has history of Vitamin D Deficiency and last Vitamin D was 03/29/2013: Vit D, 25-Hydroxy 71  Medication List   ALPRAZolam 1 MG tablet  Commonly known as:  XANAX  Take 1 tablet (1 mg total) by mouth 3 (three) times daily as needed for anxiety.     aspirin 81 MG tablet  Take 81 mg by mouth daily.     buPROPion 150 MG 24 hr tablet  Commonly known as:  WELLBUTRIN XL  Take 150 mg by mouth daily.     cholecalciferol 1000 UNITS tablet  Commonly known as:  VITAMIN D  Take 1,000 Units by mouth daily.     Estradiol 0.25 MG/0.25GM Gel  Place onto the skin.     progesterone 100 MG capsule  Commonly known as:  PROMETRIUM  Take 100 mg by mouth daily.     tretinoin 0.1 % cream  Commonly known as:  RETIN-A  Apply topically at bedtime.     Allergies  Allergen Reactions  . Dtap-Ipv Vaccine   . Flexeril [Cyclobenzaprine]    Past Medical History  Diagnosis Date  . Hypertension   . GERD (gastroesophageal reflux disease)   .  Palpitations   . Poor sleep hygiene    Past Surgical History  Procedure Laterality Date  . Squint repair Right 1974   Family History  Problem Relation Age of Onset  . Cancer Mother   . COPD Father    History  Substance Use Topics  . Smoking status: Never Smoker   . Smokeless tobacco: Not on file  . Alcohol Use: 3.5 oz/week    7 drink(s) per week    ROS Constitutional: Denies fever, chills, weight loss/gain, headaches, insomnia, fatigue, night sweats, and change in appetite. Eyes: Denies redness, blurred vision, diplopia, discharge, itchy, watery eyes.  ENT: Denies discharge, congestion, post nasal drip, epistaxis, sore throat, earache, hearing loss, dental pain, Tinnitus, Vertigo, Sinus pain, snoring.  Cardio: Denies chest pain, palpitations, irregular heartbeat, syncope, dyspnea, diaphoresis, orthopnea, PND, claudication, edema Respiratory: denies cough, dyspnea, DOE, pleurisy, hoarseness, laryngitis, wheezing.  Gastrointestinal: Denies dysphagia, heartburn, reflux, water brash, pain, cramps, nausea, vomiting, bloating, diarrhea, constipation, hematemesis, melena, hematochezia, jaundice, hemorrhoids Genitourinary: Denies dysuria, frequency, urgency, nocturia, hesitancy, discharge, hematuria, flank pain Breast: Breast lumps, nipple discharge, bleeding.  Musculoskeletal: Denies arthralgia, myalgia, stiffness, Jt. Swelling, pain, limp, and strain/sprain. Denies falls. Skin: Denies puritis, rash, hives, warts, acne, eczema, changing in skin lesion Neuro: No weakness, tremor, incoordination, spasms, paresthesia, pain Psychiatric: Denies confusion, memory loss, sensory  loss. Denies Depression. Endocrine: Denies change in weight, skin, hair change, nocturia, and paresthesia, diabetic polys, visual blurring, hyper / hypo glycemic episodes.  Heme/Lymph: No excessive bleeding, bruising, enlarged lymph nodes.  Physical Exam  BP 116/80  Pulse 72  Temp 99.1 F   Resp 16  Ht 5\' 1"    Wt  112 lb 12.8 oz   BMI 21.32 kg/m2  General Appearance: Well nourished and in no apparent distress. Eyes: PERRLA, EOMs, conjunctiva no swelling or erythema, normal fundi and vessels. Sinuses: No frontal/maxillary tenderness ENT/Mouth: EACs patent / TMs  nl. Nares clear without erythema, swelling, mucoid exudates. Oral hygiene is good. No erythema, swelling, or exudate. Tongue normal, non-obstructing. Tonsils not swollen or erythematous. Hearing normal.  Neck: Supple, thyroid normal. No bruits, nodes or JVD. Respiratory: Respiratory effort normal.  BS equal and clear bilateral without rales, rhonci, wheezing or stridor. Cardio: Heart sounds are normal with regular rate and rhythm and no murmurs, rubs or gallops. Peripheral pulses are normal and equal bilaterally without edema. No aortic or femoral bruits. Chest: symmetric with normal excursions and percussion. Abdomen: Flat, soft, with bowl sounds. Nontender, no guarding, rebound, hernias, masses, or organomegaly.  Lymphatics: Non tender without lymphadenopathy.  Musculoskeletal: Full ROM all peripheral extremities, joint stability, 5/5 strength, and normal gait. Skin: Warm and dry without rashes, lesions, cyanosis, clubbing or  ecchymosis.  Neuro: Cranial nerves intact, reflexes equal bilaterally. Normal muscle tone, no cerebellar symptoms. Sensation intact.  Pysch: Awake and oriented X 3, normal affect, Insight and Judgment appropriate.   Assessment and Plan  1. Annual Screening Examination 2. Hypertension,remote 3. Palpitations, remote 4. GERD, Quiescent 5. Vitamin D Deficiency  Continue prudent diet as discussed, weight control, BP monitoring, regular exercise, and medications. Discussed med's effects and SE's. Screening labs and tests as requested with regular follow-up as recommended.

## 2013-11-20 LAB — CBC WITH DIFFERENTIAL/PLATELET
BASOS ABS: 0.1 10*3/uL (ref 0.0–0.1)
BASOS PCT: 1 % (ref 0–1)
Eosinophils Absolute: 0.1 10*3/uL (ref 0.0–0.7)
Eosinophils Relative: 1 % (ref 0–5)
HEMATOCRIT: 41.4 % (ref 36.0–46.0)
Hemoglobin: 14.1 g/dL (ref 12.0–15.0)
LYMPHS PCT: 32 % (ref 12–46)
Lymphs Abs: 1.9 10*3/uL (ref 0.7–4.0)
MCH: 31.2 pg (ref 26.0–34.0)
MCHC: 34.1 g/dL (ref 30.0–36.0)
MCV: 91.6 fL (ref 78.0–100.0)
MONO ABS: 0.5 10*3/uL (ref 0.1–1.0)
Monocytes Relative: 8 % (ref 3–12)
NEUTROS ABS: 3.4 10*3/uL (ref 1.7–7.7)
NEUTROS PCT: 58 % (ref 43–77)
Platelets: 252 10*3/uL (ref 150–400)
RBC: 4.52 MIL/uL (ref 3.87–5.11)
RDW: 13.6 % (ref 11.5–15.5)
WBC: 5.8 10*3/uL (ref 4.0–10.5)

## 2013-11-20 LAB — BASIC METABOLIC PANEL WITH GFR
BUN: 10 mg/dL (ref 6–23)
CHLORIDE: 103 meq/L (ref 96–112)
CO2: 26 mEq/L (ref 19–32)
Calcium: 9.5 mg/dL (ref 8.4–10.5)
Creat: 0.72 mg/dL (ref 0.50–1.10)
Glucose, Bld: 85 mg/dL (ref 70–99)
POTASSIUM: 3.9 meq/L (ref 3.5–5.3)
SODIUM: 142 meq/L (ref 135–145)

## 2013-11-20 LAB — VITAMIN B12: VITAMIN B 12: 552 pg/mL (ref 211–911)

## 2013-11-20 LAB — URINALYSIS, MICROSCOPIC ONLY
BACTERIA UA: NONE SEEN
CASTS: NONE SEEN
Crystals: NONE SEEN
Squamous Epithelial / LPF: NONE SEEN

## 2013-11-20 LAB — LIPID PANEL
CHOLESTEROL: 206 mg/dL — AB (ref 0–200)
HDL: 103 mg/dL (ref >39)
LDL Cholesterol: 88 mg/dL (ref 0–99)
Total CHOL/HDL Ratio: 2 Ratio
Triglycerides: 77 mg/dL (ref <150)
VLDL: 15 mg/dL (ref 0–40)

## 2013-11-20 LAB — HEPATIC FUNCTION PANEL
ALK PHOS: 31 U/L — AB (ref 39–117)
ALT: 18 U/L (ref 0–35)
AST: 28 U/L (ref 0–37)
Albumin: 5 g/dL (ref 3.5–5.2)
BILIRUBIN DIRECT: 0.1 mg/dL (ref 0.0–0.3)
BILIRUBIN INDIRECT: 0.3 mg/dL (ref 0.2–1.2)
BILIRUBIN TOTAL: 0.4 mg/dL (ref 0.2–1.2)
TOTAL PROTEIN: 7.1 g/dL (ref 6.0–8.3)

## 2013-11-20 LAB — VITAMIN D 25 HYDROXY (VIT D DEFICIENCY, FRACTURES): VIT D 25 HYDROXY: 83 ng/mL (ref 30–89)

## 2013-11-20 LAB — HEPATITIS B SURFACE ANTIBODY,QUALITATIVE: HEP B S AB: NEGATIVE

## 2013-11-20 LAB — MICROALBUMIN / CREATININE URINE RATIO
Creatinine, Urine: 27.2 mg/dL
MICROALB UR: 0.5 mg/dL (ref 0.00–1.89)
MICROALB/CREAT RATIO: 18.4 mg/g (ref 0.0–30.0)

## 2013-11-20 LAB — HIV ANTIBODY (ROUTINE TESTING W REFLEX): HIV: NONREACTIVE

## 2013-11-20 LAB — MAGNESIUM: MAGNESIUM: 2.1 mg/dL (ref 1.5–2.5)

## 2013-11-20 LAB — HEPATITIS C ANTIBODY: HCV Ab: NEGATIVE

## 2013-11-20 LAB — HEMOGLOBIN A1C
Hgb A1c MFr Bld: 5.3 % (ref <5.7)
Mean Plasma Glucose: 105 mg/dL (ref <117)

## 2013-11-20 LAB — TSH: TSH: 3.408 u[IU]/mL (ref 0.350–4.500)

## 2013-11-20 LAB — HEPATITIS B CORE ANTIBODY, TOTAL: Hep B Core Total Ab: NONREACTIVE

## 2013-11-20 LAB — RPR

## 2013-11-20 LAB — INSULIN, FASTING: INSULIN FASTING, SERUM: 4 u[IU]/mL (ref 3–28)

## 2013-11-20 LAB — HEPATITIS A ANTIBODY, TOTAL: Hep A Total Ab: NONREACTIVE

## 2013-11-21 LAB — HEPATITIS B E ANTIBODY: Hepatitis Be Antibody: NONREACTIVE

## 2013-11-22 LAB — TB SKIN TEST
Induration: 0 mm
TB SKIN TEST: NEGATIVE

## 2014-03-26 ENCOUNTER — Other Ambulatory Visit: Payer: Self-pay | Admitting: Obstetrics & Gynecology

## 2014-03-26 DIAGNOSIS — R928 Other abnormal and inconclusive findings on diagnostic imaging of breast: Secondary | ICD-10-CM

## 2014-04-08 ENCOUNTER — Ambulatory Visit
Admission: RE | Admit: 2014-04-08 | Discharge: 2014-04-08 | Disposition: A | Discharge status: Home / Self Care | Payer: BC Managed Care – PPO | Source: Ambulatory Visit | Attending: Obstetrics & Gynecology | Admitting: Obstetrics & Gynecology

## 2014-04-08 DIAGNOSIS — R928 Other abnormal and inconclusive findings on diagnostic imaging of breast: Secondary | ICD-10-CM

## 2014-06-11 ENCOUNTER — Ambulatory Visit: Payer: Self-pay | Admitting: Internal Medicine

## 2014-09-04 ENCOUNTER — Other Ambulatory Visit: Payer: Self-pay | Admitting: Internal Medicine

## 2014-11-25 ENCOUNTER — Encounter: Payer: Self-pay | Admitting: Internal Medicine

## 2014-11-25 ENCOUNTER — Ambulatory Visit (INDEPENDENT_AMBULATORY_CARE_PROVIDER_SITE_OTHER): Payer: BLUE CROSS/BLUE SHIELD | Admitting: Internal Medicine

## 2014-11-25 VITALS — BP 126/84 | HR 72 | Temp 97.5°F | Resp 16 | Ht 61.0 in | Wt 112.0 lb

## 2014-11-25 DIAGNOSIS — E559 Vitamin D deficiency, unspecified: Secondary | ICD-10-CM | POA: Diagnosis not present

## 2014-11-25 DIAGNOSIS — R7309 Other abnormal glucose: Secondary | ICD-10-CM

## 2014-11-25 DIAGNOSIS — Z0001 Encounter for general adult medical examination with abnormal findings: Secondary | ICD-10-CM

## 2014-11-25 DIAGNOSIS — Z79899 Other long term (current) drug therapy: Secondary | ICD-10-CM

## 2014-11-25 DIAGNOSIS — Z8249 Family history of ischemic heart disease and other diseases of the circulatory system: Secondary | ICD-10-CM

## 2014-11-25 DIAGNOSIS — R002 Palpitations: Secondary | ICD-10-CM

## 2014-11-25 DIAGNOSIS — R6889 Other general symptoms and signs: Secondary | ICD-10-CM | POA: Diagnosis not present

## 2014-11-25 DIAGNOSIS — Z1212 Encounter for screening for malignant neoplasm of rectum: Secondary | ICD-10-CM | POA: Diagnosis not present

## 2014-11-25 DIAGNOSIS — I10 Essential (primary) hypertension: Secondary | ICD-10-CM

## 2014-11-25 DIAGNOSIS — Z111 Encounter for screening for respiratory tuberculosis: Secondary | ICD-10-CM

## 2014-11-25 DIAGNOSIS — R5383 Other fatigue: Secondary | ICD-10-CM

## 2014-11-25 DIAGNOSIS — Z6822 Body mass index (BMI) 22.0-22.9, adult: Secondary | ICD-10-CM | POA: Diagnosis not present

## 2014-11-25 DIAGNOSIS — E789 Disorder of lipoprotein metabolism, unspecified: Secondary | ICD-10-CM | POA: Diagnosis not present

## 2014-11-25 LAB — CBC WITH DIFFERENTIAL/PLATELET
Basophils Absolute: 0 10*3/uL (ref 0.0–0.1)
Basophils Relative: 0 % (ref 0–1)
EOS PCT: 3 % (ref 0–5)
Eosinophils Absolute: 0.2 10*3/uL (ref 0.0–0.7)
HCT: 42.8 % (ref 36.0–46.0)
Hemoglobin: 14.3 g/dL (ref 12.0–15.0)
LYMPHS PCT: 26 % (ref 12–46)
Lymphs Abs: 2 10*3/uL (ref 0.7–4.0)
MCH: 30.6 pg (ref 26.0–34.0)
MCHC: 33.4 g/dL (ref 30.0–36.0)
MCV: 91.6 fL (ref 78.0–100.0)
MONOS PCT: 7 % (ref 3–12)
MPV: 9 fL (ref 8.6–12.4)
Monocytes Absolute: 0.5 10*3/uL (ref 0.1–1.0)
NEUTROS PCT: 64 % (ref 43–77)
Neutro Abs: 4.8 10*3/uL (ref 1.7–7.7)
Platelets: 236 10*3/uL (ref 150–400)
RBC: 4.67 MIL/uL (ref 3.87–5.11)
RDW: 13.6 % (ref 11.5–15.5)
WBC: 7.5 10*3/uL (ref 4.0–10.5)

## 2014-11-25 MED ORDER — MUPIROCIN CALCIUM 2 % EX CREA: TOPICAL_CREAM | CUTANEOUS | Status: DC

## 2014-11-25 MED ORDER — DIVIGEL 1 MG/GM TD GEL: TRANSDERMAL | Status: AC

## 2014-11-25 MED ORDER — BUPROPION HCL ER (XL) 150 MG PO TB24: 150.0000 mg | ORAL_TABLET | Freq: Every day | ORAL | Status: DC

## 2014-11-25 MED ORDER — ALPRAZOLAM 1 MG PO TABS: ORAL_TABLET | ORAL | Status: AC

## 2014-11-25 MED ORDER — PROGESTERONE MICRONIZED 100 MG PO CAPS: 100.0000 mg | ORAL_CAPSULE | Freq: Every day | ORAL | Status: AC

## 2014-11-25 NOTE — Patient Instructions (Signed)
Recommend Adult Low dose Aspirin or coated  Aspirin 81 mg daily   To reduce risk of Colon Cancer 20 %,   Skin Cancer 26 % ,   Melanoma 46%   and   Pancreatic cancer 60% ++++++++++++++++++ Vitamin D goal is between 70-100.   Please make sure that you are taking your Vitamin D as directed.   It is very important as a natural anti-inflammatory   helping hair, skin, and nails, as well as reducing stroke and heart attack risk.   It helps your bones and helps with mood.  It also decreases numerous cancer risks so please take it as directed.   Low Vit D is associated with a 200-300% higher risk for CANCER   and 200-300% higher risk for HEART   ATTACK  &  STROKE.   ......................................  It is also associated with higher death rate at younger ages,   autoimmune diseases like Rheumatoid arthritis, Lupus, Multiple Sclerosis.     Also many other serious conditions, like depression, Alzheimer's  Dementia, infertility, muscle aches, fatigue, fibromyalgia - just to name a few.  +++++++++++++++++++  Recommend the book "The END of DIETING" by Dr Joel Fuhrman   & the book "The END of DIABETES " by Dr Joel Fuhrman  At [employer] - get book & Audio CD's     Being diabetic has a  300% increased risk for heart attack, stroke, cancer, and alzheimer- type vascular dementia. It is very important that you work harder with diet by avoiding all foods that are white. Avoid white rice (brown & wild rice is OK), white potatoes (sweetpotatoes in moderation is OK), White bread or wheat bread or anything made out of white flour like bagels, donuts, rolls, buns, biscuits, cakes, pastries, cookies, pizza crust, and pasta (made from white flour & egg whites) - vegetarian pasta or spinach or wheat pasta is OK. Multigrain breads like Arnold's or [employer], or multigrain sandwich thins or flatbreads.  Diet, exercise and weight loss can reverse and cure diabetes in the early stages.   Diet, exercise and weight loss is very important in the control and prevention of complications of diabetes which affects every system in your body, ie. Brain - dementia/stroke, eyes - glaucoma/blindness, heart - heart attack/heart failure, kidneys - dialysis, stomach - gastric paralysis, intestines - malabsorption, nerves - severe painful neuritis, circulation - gangrene & loss of a leg(s), and finally cancer and Alzheimers.    I recommend avoid fried & greasy foods,  sweets/candy, white rice (brown or wild rice or Quinoa is OK), white potatoes (sweet potatoes are OK) - anything made from white flour - bagels, doughnuts, rolls, buns, biscuits,white and wheat breads, pizza crust and traditional pasta made of white flour & egg white(vegetarian pasta or spinach or wheat pasta is OK).  Multi-grain bread is OK - like multi-grain flat bread or sandwich thins. Avoid alcohol in excess. Exercise is also important.    Eat all the vegetables you want - avoid meat, especially red meat and dairy - especially cheese.  Cheese is the most concentrated form of trans-fats which is the worst thing to clog up our arteries. Veggie cheese is OK which can be found in the fresh produce section at [employer] or [employer] or [employer]  ++++++++++++++++++++++++++   Preventive Care for Adults  A healthy lifestyle and preventive care can promote health and wellness. Preventive health guidelines for women include the following key practices.  A routine yearly physical is a good way   to check with your health care provider about your health and preventive screening. It is a chance to share any concerns and updates on your health and to receive a thorough exam.  Visit your dentist for a routine exam and preventive care every 6 months. Brush your teeth twice a day and floss once a day. Good oral hygiene prevents tooth decay and gum disease.  The frequency of eye exams is based on your age, health, family medical history, use  of contact lenses, and other factors. Follow your health care provider's recommendations for frequency of eye exams.  Eat a healthy diet. Foods like vegetables, fruits, whole grains, low-fat dairy products, and lean protein foods contain the nutrients you need without too many calories. Decrease your intake of foods high in solid fats, added sugars, and salt. Eat the right amount of calories for you.Get information about a proper diet from your health care provider, if necessary.  Regular physical exercise is one of the most important things you can do for your health. Most adults should get at least 150 minutes of moderate-intensity exercise (any activity that increases your heart rate and causes you to sweat) each week. In addition, most adults need muscle-strengthening exercises on 2 or more days a week.  Maintain a healthy weight. The body mass index (BMI) is a screening tool to identify possible weight problems. It provides an estimate of body fat based on height and weight. Your health care provider can find your BMI and can help you achieve or maintain a healthy weight.For adults 20 years and older:  A BMI below 18.5 is considered underweight.  A BMI of 18.5 to 24.9 is normal.  A BMI of 25 to 29.9 is considered overweight.  A BMI of 30 and above is considered obese.  Maintain normal blood lipids and cholesterol levels by exercising and minimizing your intake of saturated fat. Eat a balanced diet with plenty of fruit and vegetables. Blood tests for lipids and cholesterol should begin at age 49 and be repeated every 5 years. If your lipid or cholesterol levels are high, you are over 50, or you are at high risk for heart disease, you may need your cholesterol levels checked more frequently.Ongoing high lipid and cholesterol levels should be treated with medicines if diet and exercise are not working.  If you smoke, find out from your health care provider how to quit. If you do not use  tobacco, do not start.  Lung cancer screening is recommended for adults aged 6-80 years who are at high risk for developing lung cancer because of a history of smoking. A yearly low-dose CT scan of the lungs is recommended for people who have at least a 30-pack-year history of smoking and are a current smoker or have quit within the past 15 years. A pack year of smoking is smoking an average of 1 pack of cigarettes a day for 1 year (for example: 1 pack a day for 30 years or 2 packs a day for 15 years). Yearly screening should continue until the smoker has stopped smoking for at least 15 years. Yearly screening should be stopped for people who develop a health problem that would prevent them from having lung cancer treatment.  High blood pressure causes heart disease and increases the risk of stroke. Your blood pressure should be checked at least every 1 to 2 years. Ongoing high blood pressure should be treated with medicines if weight loss and exercise do not work.  If you  are 21-72 years old, ask your health care provider if you should take aspirin to prevent strokes.  Diabetes screening involves taking a blood sample to check your fasting blood sugar level. This should be done once every 3 years, after age 16, if you are within normal weight and without risk factors for diabetes. Testing should be considered at a younger age or be carried out more frequently if you are overweight and have at least 1 risk factor for diabetes.  Breast cancer screening is essential preventive care for women. You should practice "breast self-awareness." This means understanding the normal appearance and feel of your breasts and may include breast self-examination. Any changes detected, no matter how small, should be reported to a health care provider. Women in their 4s and 30s should have a clinical breast exam (CBE) by a health care provider as part of a regular health exam every 1 to 3 years. After age 39, women should  have a CBE every year. Starting at age 31, women should consider having a mammogram (breast X-ray test) every year. Women who have a family history of breast cancer should talk to their health care provider about genetic screening. Women at a high risk of breast cancer should talk to their health care providers about having an MRI and a mammogram every year.  Breast cancer gene (BRCA)-related cancer risk assessment is recommended for women who have family members with BRCA-related cancers. BRCA-related cancers include breast, ovarian, tubal, and peritoneal cancers. Having family members with these cancers may be associated with an increased risk for harmful changes (mutations) in the breast cancer genes BRCA1 and BRCA2. Results of the assessment will determine the need for genetic counseling and BRCA1 and BRCA2 testing.  Routine pelvic exams to screen for cancer are no longer recommended for nonpregnant women who are considered low risk for cancer of the pelvic organs (ovaries, uterus, and vagina) and who do not have symptoms. Ask your health care provider if a screening pelvic exam is right for you.  If you have had past treatment for cervical cancer or a condition that could lead to cancer, you need Pap tests and screening for cancer for at least 20 years after your treatment. If Pap tests have been discontinued, your risk factors (such as having a new sexual partner) need to be reassessed to determine if screening should be resumed. Some women have medical problems that increase the chance of getting cervical cancer. In these cases, your health care provider may recommend more frequent screening and Pap tests.  Colorectal cancer can be detected and often prevented. Most routine colorectal cancer screening begins at the age of 85 years and continues through age 75 years. However, your health care provider may recommend screening at an earlier age if you have risk factors for colon cancer. On a yearly  basis, your health care provider may provide home test kits to check for hidden blood in the stool. Use of a small camera at the end of a tube, to directly examine the colon (sigmoidoscopy or colonoscopy), can detect the earliest forms of colorectal cancer. Talk to your health care provider about this at age 83, when routine screening begins. Direct exam of the colon should be repeated every 5-10 years through age 31 years, unless early forms of pre-cancerous polyps or small growths are found.  Hepatitis C blood testing is recommended for all people born from [birth year] through 1965 and any individual with known risks for hepatitis C.  Pra  Osteoporosis  is a disease in which the bones lose minerals and strength with aging. This can result in serious bone fractures or breaks. The risk of osteoporosis can be identified using a bone density scan. Women ages 67 years and over and women at risk for fractures or osteoporosis should discuss screening with their health care providers. Ask your health care provider whether you should take a calcium supplement or vitamin D to reduce the rate of osteoporosis.  Menopause can be associated with physical symptoms and risks. Hormone replacement therapy is available to decrease symptoms and risks. You should talk to your health care provider about whether hormone replacement therapy is right for you.  Use sunscreen. Apply sunscreen liberally and repeatedly throughout the day. You should seek shade when your shadow is shorter than you. Protect yourself by wearing long sleeves, pants, a wide-brimmed hat, and sunglasses year round, whenever you are outdoors.  Once a month, do a whole body skin exam, using a mirror to look at the skin on your back. Tell your health care provider of new moles, moles that have irregular borders, moles that are larger than a pencil eraser, or moles that have changed in shape or color.  Stay current with required vaccines  (immunizations).  Influenza vaccine. All adults should be immunized every year.  Tetanus, diphtheria, and acellular pertussis (Td, Tdap) vaccine. Pregnant women should receive 1 dose of Tdap vaccine during each pregnancy. The dose should be obtained regardless of the length of time since the last dose. Immunization is preferred during the 27th-36th week of gestation. An adult who has not previously received Tdap or who does not know her vaccine status should receive 1 dose of Tdap. This initial dose should be followed by tetanus and diphtheria toxoids (Td) booster doses every 10 years. Adults with an unknown or incomplete history of completing a 3-dose immunization series with Td-containing vaccines should begin or complete a primary immunization series including a Tdap dose. Adults should receive a Td booster every 10 years.  Varicella vaccine. An adult without evidence of immunity to varicella should receive 2 doses or a second dose if she has previously received 1 dose. Pregnant females who do not have evidence of immunity should receive the first dose after pregnancy. This first dose should be obtained before leaving the health care facility. The second dose should be obtained 4-8 weeks after the first dose.  Human papillomavirus (HPV) vaccine. Females aged 13-26 years who have not received the vaccine previously should obtain the 3-dose series. The vaccine is not recommended for use in pregnant females. However, pregnancy testing is not needed before receiving a dose. If a female is found to be pregnant after receiving a dose, no treatment is needed. In that case, the remaining doses should be delayed until after the pregnancy. Immunization is recommended for any person with an immunocompromised condition through the age of 11 years if she did not get any or all doses earlier. During the 3-dose series, the second dose should be obtained 4-8 weeks after the first dose. The third dose should be obtained  24 weeks after the first dose and 16 weeks after the second dose.  Zoster vaccine. One dose is recommended for adults aged 58 years or older unless certain conditions are present.  Measles, mumps, and rubella (MMR) vaccine. Adults born before [birth year] generally are considered immune to measles and mumps. Adults born in [birth year] or later should have 1 or more doses of MMR vaccine unless there is a contraindication  to the vaccine or there is laboratory evidence of immunity to each of the three diseases. A routine second dose of MMR vaccine should be obtained at least 28 days after the first dose for students attending postsecondary schools, health care workers, or international travelers. People who received inactivated measles vaccine or an unknown type of measles vaccine during 1963-1967 should receive 2 doses of MMR vaccine. People who received inactivated mumps vaccine or an unknown type of mumps vaccine before 1979 and are at high risk for mumps infection should consider immunization with 2 doses of MMR vaccine. For females of childbearing age, rubella immunity should be determined. If there is no evidence of immunity, females who are not pregnant should be vaccinated. If there is no evidence of immunity, females who are pregnant should delay immunization until after pregnancy. Unvaccinated health care workers born before [birth year] who lack laboratory evidence of measles, mumps, or rubella immunity or laboratory confirmation of disease should consider measles and mumps immunization with 2 doses of MMR vaccine or rubella immunization with 1 dose of MMR vaccine.  Pneumococcal 13-valent conjugate (PCV13) vaccine. When indicated, a person who is uncertain of her immunization history and has no record of immunization should receive the PCV13 vaccine. An adult aged 26 years or older who has certain medical conditions and has not been previously immunized should receive 1 dose of PCV13 vaccine. This PCV13 should be followed  with a dose of pneumococcal polysaccharide (PPSV23) vaccine. The PPSV23 vaccine dose should be obtained at least 8 weeks after the dose of PCV13 vaccine. An adult aged 78 years or older who has certain medical conditions and previously received 1 or more doses of PPSV23 vaccine should receive 1 dose of PCV13. The PCV13 vaccine dose should be obtained 1 or more years after the last PPSV23 vaccine dose.    Pneumococcal polysaccharide (PPSV23) vaccine. When PCV13 is also indicated, PCV13 should be obtained first. All adults aged 9 years and older should be immunized. An adult younger than age 13 years who has certain medical conditions should be immunized. Any person who resides in a nursing home or long-term care facility should be immunized. An adult smoker should be immunized. People with an immunocompromised condition and certain other conditions should receive both PCV13 and PPSV23 vaccines. People with human immunodeficiency virus (HIV) infection should be immunized as soon as possible after diagnosis. Immunization during chemotherapy or radiation therapy should be avoided. Routine use of PPSV23 vaccine is not recommended for American Indians, [place] Natives, or people younger than 65 years unless there are medical conditions that require PPSV23 vaccine. When indicated, people who have unknown immunization and have no record of immunization should receive PPSV23 vaccine. One-time revaccination 5 years after the first dose of PPSV23 is recommended for people aged 19-64 years who have chronic kidney failure, nephrotic syndrome, asplenia, or immunocompromised conditions. People who received 1-2 doses of PPSV23 before age 64 years should receive another dose of PPSV23 vaccine at age 53 years or later if at least 5 years have passed since the previous dose. Doses of PPSV23 are not needed for people immunized with PPSV23 at or after age 23 years.  Preventive Services / Frequency   Ages 73 to 11 years  Blood  pressure check.  Lipid and cholesterol check.  Lung cancer screening. / Every year if you are aged 42-80 years and have a 30-pack-year history of smoking and currently smoke or have quit within the past 15 years. Yearly screening is stopped once  you have quit smoking for at least 15 years or develop a health problem that would prevent you from having lung cancer treatment.  Clinical breast exam.** / Every year after age 58 years.  BRCA-related cancer risk assessment.** / For women who have family members with a BRCA-related cancer (breast, ovarian, tubal, or peritoneal cancers).  Mammogram.** / Every year beginning at age 59 years and continuing for as long as you are in good health. Consult with your health care provider.  Pap test.** / Every 3 years starting at age 36 years through age 25 or 70 years with a history of 3 consecutive normal Pap tests.  HPV screening.** / Every 3 years from ages 49 years through ages 57 to 89 years with a history of 3 consecutive normal Pap tests.  Fecal occult blood test (FOBT) of stool. / Every year beginning at age 63 years and continuing until age 63 years. You may not need to do this test if you get a colonoscopy every 10 years.  Flexible sigmoidoscopy or colonoscopy.** / Every 5 years for a flexible sigmoidoscopy or every 10 years for a colonoscopy beginning at age 56 years and continuing until age 18 years.  Hepatitis C blood test.** / For all people born from [birth year] through 1964 and any individual with known risks for hepatitis C.  Skin self-exam. / Monthly.  Influenza vaccine. / Every year.  Tetanus, diphtheria, and acellular pertussis (Tdap/Td) vaccine.** / Consult your health care provider. Pregnant women should receive 1 dose of Tdap vaccine during each pregnancy. 1 dose of Td every 10 years.  Varicella vaccine.** / Consult your health care provider. Pregnant females who do not have evidence of immunity should receive the first dose after  pregnancy.  Zoster vaccine.** / 1 dose for adults aged 74 years or older.  Pneumococcal 13-valent conjugate (PCV13) vaccine.** / Consult your health care provider.  Pneumococcal polysaccharide (PPSV23) vaccine.** / 1 to 2 doses if you smoke cigarettes or if you have certain conditions.  Meningococcal vaccine.** / Consult your health care provider.  Hepatitis A vaccine.** / Consult your health care provider.  Hepatitis B vaccine.** / Consult your health care provider. Screening for abdominal aortic aneurysm (AAA)  by ultrasound is recommended for people over 50 who have history of high blood pressure or who are current or former smokers.

## 2014-11-25 NOTE — Progress Notes (Signed)
Patient ID: Leah Patrick, female   DOB: November 06, 1960, 54 y.o.   MRN: [id number]   Comprehensive Examination  This very nice 54 y.o. [facility] presents for complete physical.  Patient has been followed for hx/o labile HTN and Vitamin D Deficiency and screened for abnormal lipids & glucose intolerance. Patient has been widowed now about 3 years and still seems to be grieving her loss, but denies any depressive sx's.     Patient has remote hx/o palpitations and also elevated BP in 2005 and has been monitored expectantly since then.  Patient's BP has been normal over the last 10+ years and she denies any cardiac symptoms as chest pain, recent palpitations, shortness of breath, dizziness or ankle swelling. Today's BP: 126/84 mmHg     Patient's hyperlipidemia is controlled with diet. Patient denies myalgias or other medication SE's. Last lipids were at goal with elevated Total Chol due to extremely high HDL of 103!  Lab Results  Component Value Date   CHOL 206* 11/19/2013   HDL 103 11/19/2013   LDLCALC 88 11/19/2013   TRIG 77 11/19/2013   CHOLHDL 2.0 11/19/2013     Patient has been screened for glucose intolerance and patient denies reactive hypoglycemic symptoms, visual blurring, diabetic polys, or paresthesias. Last A1c was 5.3% in Aug 2015.    Patient is on staged or sequential  ERT per her Gynecologist. Finally, She has history of Vitamin D Deficiency and has been on supplemental replacement and last Vitamin D was 83 in Aug 2015.      Medication Sig  . ALPRAZolam  1 MG tablet TAKE 1 TABLET THREE TIMES A DAY AS NEEDED FOR ANXIETY  . aspirin 81 MG tablet Take 81 mg by mouth daily.  Marland Kitchen buPROPion  XL 150 MG 24 hr tablet Take 150 mg by mouth daily.  Marland Kitchen VITAMIN D 1000 UNITS tablet Take 1,000 Units by mouth daily.  . Estradiol 0.25 MG/0.25GM GEL Place onto the skin.  . progesterone (PROMETRIUM) 100 MG cap Take 100 mg by mouth daily.  Marland Kitchen tretinoin (RETIN-A) 0.1 % cream Apply topically at bedtime.   Allergies   Allergen Reactions  . Dtap-Ipv Vaccine   . Flexeril [Cyclobenzaprine]    Past Medical History  Diagnosis Date  . Hypertension   . GERD (gastroesophageal reflux disease)   . Palpitations   . Poor sleep hygiene    Health Maintenance  Topic Date Due  . PAP SMEAR  01/04/1980  . TETANUS/TDAP  01/03/1981  . COLONOSCOPY  01/04/2012  . INFLUENZA VACCINE  11/18/2014  . MAMMOGRAM  04/08/2016  . Hepatitis C Screening  Completed  . HIV Screening  Completed   Immunization History  Administered Date(s) Administered  . PPD Test 11/19/2013  . Pneumococcal Polysaccharide-23 10/29/2008   Past Surgical History  Procedure Laterality Date  . Squint repair Right 1974   Family History  Problem Relation Age of Onset  . Cancer Mother   . COPD Father    History  Substance Use Topics  . Smoking status: Never Smoker   . Smokeless tobacco: Not on file  . Alcohol Use: 6.0 oz/week    10 Standard drinks or equivalent per week     Comment: wine    ROS Constitutional: Denies fever, chills, weight loss/gain, headaches, insomnia,  night sweats, and change in appetite. Does c/o fatigue. Eyes: Denies redness, blurred vision, diplopia, discharge, itchy, watery eyes.  ENT: Denies discharge, congestion, post nasal drip, epistaxis, sore throat, earache, hearing loss, dental pain, Tinnitus, Vertigo,  Sinus pain, snoring.  Cardio: Denies chest pain, palpitations, irregular heartbeat, syncope, dyspnea, diaphoresis, orthopnea, PND, claudication, edema Respiratory: denies cough, dyspnea, DOE, pleurisy, hoarseness, laryngitis, wheezing.  Gastrointestinal: Denies dysphagia, heartburn, reflux, water brash, pain, cramps, nausea, vomiting, bloating, diarrhea, constipation, hematemesis, melena, hematochezia, jaundice, hemorrhoids Genitourinary: Denies dysuria, frequency, urgency, nocturia, hesitancy, discharge, hematuria, flank pain Breast: Breast lumps, nipple discharge, bleeding.  Musculoskeletal: Denies  arthralgia, myalgia, stiffness, Jt. Swelling, pain, limp, and strain/sprain. Denies falls. Skin: Denies puritis, rash, hives, warts, acne, eczema, changing in skin lesion Neuro: No weakness, tremor, incoordination, spasms, paresthesia, pain Psychiatric: Denies confusion, memory loss, sensory loss. Denies Depression. Endocrine: Denies change in weight, skin, hair change, nocturia, and paresthesia, diabetic polys, visual blurring, hyper / hypo glycemic episodes.  Heme/Lymph: No excessive bleeding, bruising, enlarged lymph nodes.  Physical Exam  BP 126/84   Pulse 72  Temp 97.5 F   Resp 16  Ht 5\' 1"   Wt 112 lb     BMI 21.17   General Appearance: Well nourished and in no apparent distress. Eyes: PERRLA, EOMs, conjunctiva no swelling or erythema, normal fundi and vessels. Sinuses: No frontal/maxillary tenderness ENT/Mouth: EACs patent / TMs  nl. Nares clear without erythema, swelling, mucoid exudates. Oral hygiene is good. No erythema, swelling, or exudate. Tongue normal, non-obstructing. Tonsils not swollen or erythematous. Hearing normal.  Neck: Supple, thyroid normal. No bruits, nodes or JVD. Respiratory: Respiratory effort normal.  BS equal and clear bilateral without rales, rhonci, wheezing or stridor. Cardio: Heart sounds are normal with regular rate and rhythm and no murmurs, rubs or gallops. Peripheral pulses are normal and equal bilaterally without edema. No aortic or femoral bruits. Chest: symmetric with normal excursions and percussion. Breasts: Deferred to GYN Abdomen: Flat, soft, with bowel sounds. Nontender, no guarding, rebound, hernias, masses, or organomegaly.  Lymphatics: Non tender without lymphadenopathy.  Musculoskeletal: Full ROM all peripheral extremities, joint stability, 5/5 strength, and normal gait. Skin: Warm and dry without rashes, lesions, cyanosis, clubbing or  ecchymosis.  Neuro: Cranial nerves intact, reflexes equal bilaterally. Normal muscle tone, no  cerebellar symptoms. Sensation intact.  Pysch: Awake and oriented X 3, normal affect, Insight and Judgment appropriate.   Assessment and Plan  1. Labile Hypertension, hx/o  - Microalbumin / creatinine urine ratio - TSH  2. Palpitations   3. Abnormal glucose  - Hemoglobin A1c - Insulin, random  4. Vitamin D deficiency  - Vit D  25 hydroxy   5. Other fatigue  - Vitamin B12 - Iron and TIBC - TSH  6. Medication management  - Urine Microscopic - CBC with Differential/Platelet - BASIC METABOLIC PANEL WITH GFR - Hepatic function panel - Magnesium  7. Family history of cardiovascular disease   8. Screening for rectal cancer   9. Abnormal cholesterol test  - Lipid panel   Continue prudent diet as discussed, weight control, BP monitoring, regular exercise, and medications. Discussed med's effects and SE's. Screening labs and tests as requested with regular follow-up as recommended.  Over 40 minutes of exam, counseling, chart review was performed.

## 2014-11-26 LAB — LIPID PANEL
CHOLESTEROL: 197 mg/dL (ref 125–200)
HDL: 91 mg/dL (ref >=46)
LDL Cholesterol: 92 mg/dL (ref <130)
Total CHOL/HDL Ratio: 2.2 Ratio (ref <=5.0)
Triglycerides: 72 mg/dL (ref <150)
VLDL: 14 mg/dL (ref <30)

## 2014-11-26 LAB — IRON AND TIBC
%SAT: 38 % (ref 20–55)
IRON: 115 ug/dL (ref 42–145)
TIBC: 305 ug/dL (ref 250–470)
UIBC: 190 ug/dL (ref 125–400)

## 2014-11-26 LAB — HEPATIC FUNCTION PANEL
ALBUMIN: 4.8 g/dL (ref 3.6–5.1)
ALK PHOS: 50 U/L (ref 33–130)
ALT: 15 U/L (ref 6–29)
AST: 23 U/L (ref 10–35)
Bilirubin, Direct: 0.1 mg/dL (ref <=0.2)
Indirect Bilirubin: 0.3 mg/dL (ref 0.2–1.2)
Total Bilirubin: 0.4 mg/dL (ref 0.2–1.2)
Total Protein: 7.1 g/dL (ref 6.1–8.1)

## 2014-11-26 LAB — URINALYSIS, MICROSCOPIC ONLY
BACTERIA UA: NONE SEEN [HPF]
Casts: NONE SEEN [LPF]
Crystals: NONE SEEN [HPF]
RBC / HPF: NONE SEEN RBC/HPF (ref <=2)
Squamous Epithelial / LPF: NONE SEEN [HPF] (ref <=5)
WBC, UA: NONE SEEN WBC/HPF (ref <=5)
Yeast: NONE SEEN [HPF]

## 2014-11-26 LAB — VITAMIN D 25 HYDROXY (VIT D DEFICIENCY, FRACTURES): Vit D, 25-Hydroxy: 49 ng/mL (ref 30–100)

## 2014-11-26 LAB — MICROALBUMIN / CREATININE URINE RATIO
Creatinine, Urine: 35.5 mg/dL
MICROALB UR: 1.4 mg/dL (ref <2.0)
Microalb Creat Ratio: 39.4 mg/g — ABNORMAL HIGH (ref 0.0–30.0)

## 2014-11-26 LAB — VITAMIN B12: VITAMIN B 12: 622 pg/mL (ref 211–911)

## 2014-11-26 LAB — HEMOGLOBIN A1C
HEMOGLOBIN A1C: 5.2 % (ref <5.7)
MEAN PLASMA GLUCOSE: 103 mg/dL (ref <117)

## 2014-11-26 LAB — BASIC METABOLIC PANEL WITH GFR
BUN: 12 mg/dL (ref 7–25)
CALCIUM: 9.3 mg/dL (ref 8.6–10.4)
CO2: 27 mmol/L (ref 20–31)
Chloride: 101 mmol/L (ref 98–110)
Creat: 0.75 mg/dL (ref 0.50–1.05)
GFR, Est African American: >89 mL/min (ref >=60)
GFR, Est Non African American: >89 mL/min (ref >=60)
Glucose, Bld: 79 mg/dL (ref 65–99)
Potassium: 3.9 mmol/L (ref 3.5–5.3)
Sodium: 141 mmol/L (ref 135–146)

## 2014-11-26 LAB — TSH: TSH: 2.298 u[IU]/mL (ref 0.350–4.500)

## 2014-11-26 LAB — MAGNESIUM: Magnesium: 2 mg/dL (ref 1.5–2.5)

## 2014-11-26 LAB — INSULIN, RANDOM: INSULIN: 2.3 u[IU]/mL (ref 2.0–19.6)

## 2014-11-26 NOTE — Addendum Note (Signed)
Addended by: Melbourne Abts C on: 11/26/2014 08:47 AM   Modules accepted: Orders

## 2014-11-27 LAB — TB SKIN TEST
Induration: 0 mm
TB Skin Test: NEGATIVE

## 2015-03-17 ENCOUNTER — Other Ambulatory Visit: Payer: Self-pay | Admitting: Internal Medicine

## 2015-03-17 DIAGNOSIS — L089 Local infection of the skin and subcutaneous tissue, unspecified: Secondary | ICD-10-CM

## 2015-03-17 MED ORDER — MUPIROCIN CALCIUM 2 % EX CREA: TOPICAL_CREAM | CUTANEOUS | Status: DC

## 2015-03-19 ENCOUNTER — Other Ambulatory Visit: Payer: Self-pay | Admitting: *Deleted

## 2015-03-19 DIAGNOSIS — L089 Local infection of the skin and subcutaneous tissue, unspecified: Secondary | ICD-10-CM

## 2015-03-19 MED ORDER — MUPIROCIN 2 % EX OINT: 1.0000 "application " | TOPICAL_OINTMENT | Freq: Two times a day (BID) | CUTANEOUS | Status: DC

## 2015-03-19 MED ORDER — TRETINOIN 0.1 % EX CREA: TOPICAL_CREAM | Freq: Every day | CUTANEOUS | Status: DC

## 2015-03-25 ENCOUNTER — Other Ambulatory Visit: Payer: Self-pay | Admitting: *Deleted

## 2015-03-25 MED ORDER — TRETINOIN 0.1 % EX CREA: TOPICAL_CREAM | Freq: Every day | CUTANEOUS | Status: DC

## 2015-04-17 ENCOUNTER — Ambulatory Visit (INDEPENDENT_AMBULATORY_CARE_PROVIDER_SITE_OTHER): Payer: BLUE CROSS/BLUE SHIELD | Admitting: Internal Medicine

## 2015-04-17 ENCOUNTER — Encounter: Payer: Self-pay | Admitting: Internal Medicine

## 2015-04-17 VITALS — BP 106/78 | HR 72 | Temp 97.9°F | Resp 16 | Ht 61.0 in | Wt 117.4 lb

## 2015-04-17 DIAGNOSIS — J329 Chronic sinusitis, unspecified: Secondary | ICD-10-CM | POA: Diagnosis not present

## 2015-04-17 MED ORDER — PREDNISONE 20 MG PO TABS: ORAL_TABLET | ORAL | Status: DC

## 2015-04-17 MED ORDER — FLUTICASONE PROPIONATE 50 MCG/ACT NA SUSP: 2.0000 | Freq: Every day | NASAL | Status: DC

## 2015-04-17 MED ORDER — AMOXICILLIN-POT CLAVULANATE 875-125 MG PO TABS: 1.0000 | ORAL_TABLET | Freq: Two times a day (BID) | ORAL | Status: DC

## 2015-04-17 NOTE — Patient Instructions (Signed)
Please take the augmentin until it is all the way gone.  Please take the prednisone daily as prescribed until it is all the way gone.  Please use saline in your nose as often as tolerated.  Please use flonase 2 sprays per each nostril right before bedtime.  Continue this medication even after this infection has resolved.  Continue to take claritin, zyrtec or allegra daily.  Please take pepcid or zantac over the counter twice daily if you would like some extra drying power.

## 2015-04-17 NOTE — Progress Notes (Signed)
Patient ID: Leah Patrick, female   DOB: [birth year], 54 y.o.   MRN: [id number]  HPI  Patient presents to the office for evaluation of sinus pressure and congestion.  It has been going on for 2 weeks.  They also endorse change in voice, postnasal drip and nasal congestion, sinus pressure, eye redness, eye swelling, clear rhinorrhea, tooth pain, full clogged ears bilaterally..  They have tried claritin, sudafed sinus 120 mg, day quil, nyquil ibuprofen.  They report that nothing has worked.  They admits to other sick contacts.  Review of Systems  Constitutional: Positive for chills and malaise/fatigue. Negative for fever.  HENT: Positive for congestion, ear pain and sore throat.   Eyes: Negative.   Respiratory: Negative for cough, shortness of breath and wheezing.   Cardiovascular: Negative for chest pain, palpitations and leg swelling.  Neurological: Positive for headaches.    PE:  Filed Vitals:   04/17/15 1142  BP: 106/78  Pulse: 72  Temp: 97.9 F (36.6 C)  Resp: 16    General:  Alert and non-toxic, WDWN, NAD HEENT: NCAT, PERLA, EOM normal, no occular discharge or erythema.  Nasal mucosal edema with sinus tenderness to palpation.  Oropharynx clear with minimal oropharyngeal edema and erythema.  Mucous membranes moist and pink. Neck:  Cervical adenopathy Chest:  RRR no MRGs.  Lungs clear to auscultation A&P with no wheezes rhonchi or rales.   Abdomen: +BS x 4 quadrants, soft, non-tender, no guarding, rigidity, or rebound. Skin: warm and dry no rash Neuro: A&Ox4, CN II-XII grossly intact  Assessment and Plan:   1. Chronic sinusitis, unspecified location -augmentin x 2 weeks -flonase -prednisone 2 week taper -nasal saline -cont daily antihistamine -if no improvement send to ENT

## 2015-04-28 ENCOUNTER — Other Ambulatory Visit: Payer: Self-pay | Admitting: *Deleted

## 2015-04-28 DIAGNOSIS — Z1212 Encounter for screening for malignant neoplasm of rectum: Secondary | ICD-10-CM

## 2015-04-28 LAB — POC HEMOCCULT BLD/STL (HOME/3-CARD/SCREEN)
Card #2 Fecal Occult Blod, POC: NEGATIVE
FECAL OCCULT BLD: NEGATIVE
FECAL OCCULT BLD: NEGATIVE

## 2015-05-02 ENCOUNTER — Telehealth: Payer: Self-pay

## 2015-05-02 ENCOUNTER — Other Ambulatory Visit: Payer: Self-pay | Admitting: Internal Medicine

## 2015-05-02 DIAGNOSIS — J329 Chronic sinusitis, unspecified: Secondary | ICD-10-CM

## 2015-05-02 NOTE — Telephone Encounter (Signed)
Patient states she has no relief after finishing Augmentin and Prednisone. Patient requesting referral to Dr.Newman. Please advise.

## 2015-09-29 ENCOUNTER — Other Ambulatory Visit: Payer: Self-pay | Admitting: Internal Medicine

## 2015-12-11 ENCOUNTER — Other Ambulatory Visit: Payer: Self-pay | Admitting: Internal Medicine

## 2015-12-17 ENCOUNTER — Encounter: Payer: Self-pay | Admitting: Internal Medicine

## 2016-01-07 DIAGNOSIS — H53031 Strabismic amblyopia, right eye: Secondary | ICD-10-CM | POA: Insufficient documentation

## 2016-01-07 DIAGNOSIS — H5022 Vertical strabismus, left eye: Secondary | ICD-10-CM | POA: Insufficient documentation

## 2016-01-07 DIAGNOSIS — H532 Diplopia: Secondary | ICD-10-CM | POA: Diagnosis not present

## 2016-01-07 DIAGNOSIS — H50011 Monocular esotropia, right eye: Secondary | ICD-10-CM | POA: Insufficient documentation

## 2016-02-13 ENCOUNTER — Ambulatory Visit (INDEPENDENT_AMBULATORY_CARE_PROVIDER_SITE_OTHER): Payer: BLUE CROSS/BLUE SHIELD | Admitting: Internal Medicine

## 2016-02-13 ENCOUNTER — Encounter: Payer: Self-pay | Admitting: Internal Medicine

## 2016-02-13 VITALS — BP 112/64 | HR 68 | Temp 97.7°F | Resp 16 | Ht 61.0 in | Wt 112.0 lb

## 2016-02-13 DIAGNOSIS — E559 Vitamin D deficiency, unspecified: Secondary | ICD-10-CM | POA: Diagnosis not present

## 2016-02-13 DIAGNOSIS — I10 Essential (primary) hypertension: Secondary | ICD-10-CM

## 2016-02-13 DIAGNOSIS — E789 Disorder of lipoprotein metabolism, unspecified: Secondary | ICD-10-CM

## 2016-02-13 DIAGNOSIS — Z0000 Encounter for general adult medical examination without abnormal findings: Secondary | ICD-10-CM | POA: Diagnosis not present

## 2016-02-13 DIAGNOSIS — R5383 Other fatigue: Secondary | ICD-10-CM

## 2016-02-13 DIAGNOSIS — Z136 Encounter for screening for cardiovascular disorders: Secondary | ICD-10-CM

## 2016-02-13 DIAGNOSIS — Z111 Encounter for screening for respiratory tuberculosis: Secondary | ICD-10-CM

## 2016-02-13 DIAGNOSIS — Z0001 Encounter for general adult medical examination with abnormal findings: Secondary | ICD-10-CM

## 2016-02-13 DIAGNOSIS — Z1212 Encounter for screening for malignant neoplasm of rectum: Secondary | ICD-10-CM

## 2016-02-13 DIAGNOSIS — R002 Palpitations: Secondary | ICD-10-CM

## 2016-02-13 DIAGNOSIS — Z79899 Other long term (current) drug therapy: Secondary | ICD-10-CM

## 2016-02-13 DIAGNOSIS — R7309 Other abnormal glucose: Secondary | ICD-10-CM

## 2016-02-13 LAB — CBC WITH DIFFERENTIAL/PLATELET
BASOS PCT: 0 %
Basophils Absolute: 0 cells/uL (ref 0–200)
EOS PCT: 1 %
Eosinophils Absolute: 66 cells/uL (ref 15–500)
HEMATOCRIT: 42.3 % (ref 35.0–45.0)
Hemoglobin: 14.2 g/dL (ref 11.7–15.5)
Lymphocytes Relative: 23 %
Lymphs Abs: 1518 cells/uL (ref 850–3900)
MCH: 31 pg (ref 27.0–33.0)
MCHC: 33.6 g/dL (ref 32.0–36.0)
MCV: 92.4 fL (ref 80.0–100.0)
MONO ABS: 528 {cells}/uL (ref 200–950)
MONOS PCT: 8 %
MPV: 9.9 fL (ref 7.5–12.5)
Neutro Abs: 4488 cells/uL (ref 1500–7800)
Neutrophils Relative %: 68 %
PLATELETS: 218 10*3/uL (ref 140–400)
RBC: 4.58 MIL/uL (ref 3.80–5.10)
RDW: 13.7 % (ref 11.0–15.0)
WBC: 6.6 10*3/uL (ref 3.8–10.8)

## 2016-02-13 LAB — HEPATIC FUNCTION PANEL
ALT: 15 U/L (ref 6–29)
AST: 26 U/L (ref 10–35)
Albumin: 4.5 g/dL (ref 3.6–5.1)
Alkaline Phosphatase: 43 U/L (ref 33–130)
BILIRUBIN DIRECT: 0.1 mg/dL (ref <=0.2)
BILIRUBIN INDIRECT: 0.4 mg/dL (ref 0.2–1.2)
BILIRUBIN TOTAL: 0.5 mg/dL (ref 0.2–1.2)
Total Protein: 6.7 g/dL (ref 6.1–8.1)

## 2016-02-13 LAB — LIPID PANEL
CHOL/HDL RATIO: 2 ratio (ref <=5.0)
CHOLESTEROL: 186 mg/dL (ref 125–200)
HDL: 93 mg/dL (ref >=46)
LDL CALC: 85 mg/dL (ref <130)
TRIGLYCERIDES: 42 mg/dL (ref <150)
VLDL: 8 mg/dL (ref <30)

## 2016-02-13 LAB — HEMOGLOBIN A1C
Hgb A1c MFr Bld: 4.6 % (ref <5.7)
Mean Plasma Glucose: 85 mg/dL

## 2016-02-13 NOTE — Patient Instructions (Signed)

## 2016-02-13 NOTE — Progress Notes (Signed)
[facility] Unk Pinto, M.D.    Uvaldo Bristle. Silverio Lay, P.A.-C      Starlyn Skeans, P.A.-C  [facility]                [address] [place] [id number] Telephone [phone] Telefax [phone]  Annual Screening/Preventative Visit & Comprehensive Evaluation &  Examination     This very nice 55 y.o. [facility] presents for a Screening/Preventative Visit & comprehensive evaluation and management of multiple medical co-morbidities.  Patient has been followed expectantly for hx/o labile elevated BP, abnormal lipids, glucose and Vitamin D Deficiency.      Patient's hx/o relates back to 2005 when she presented with sl elevated BP's and c/o palpitations.  Patient's BP's have since been controlled and patient denies any cardiac symptoms as chest pain, palpitations, shortness of breath, dizziness or ankle swelling. Today's BP is 112/64.      Patient's hyperlipidemia is controlled with diet. Last lipids were at goal: Lab Results  Component Value Date   CHOL 197 11/25/2014   HDL 91 11/25/2014   LDLCALC 92 11/25/2014   TRIG 72 11/25/2014   CHOLHDL 2.2 11/25/2014      Patient is monitored proactively for elevated glucoses and patient denies reactive hypoglycemic symptoms, visual blurring, diabetic polys, or paresthesias. Last A1c was at goal: Lab Results  Component Value Date   HGBA1C 5.2 11/25/2014      Finally, patient has history of Vitamin D Deficiency and last Vitamin D was still low: Lab Results  Component Value Date   VD25OH 20 11/25/2014   Current Outpatient Prescriptions on File Prior to Visit  Medication Sig  . ALPRAZolam (XANAX) 1 MG tablet TAKE 1 TAB 3 TIMES A DAY AS NEEDED - usu at HS  . aspirin 81 MG  Take 81 mg by mouth daily.  Marland Kitchen buPROPion-XL 300 MG Take 300 mg by mouth daily.  Marland Kitchen VITAMIN D 1000 UNITS  Take 3,000 Units by mouth daily.   . fluticasone   nasal  USE TWO SPRAY(S) IN EACH  NOSTRIL ONCE DAILY  . loratadine  10 MG  Take 10 mg by mouth daily as needed for allergies.  . mupirocin oint  2 % Place 1 application into the nose 2 (two) times daily.  . Zinc Takes  daily  . Tumeric  daily  . tretinoin (RETIN-A) 0.1 % crm Apply topically at bedtime.  Marland Kitchen DIVIGEL 1 MG/GM GEL Apply daily   Allergies  Allergen Reactions  . Dtap-Ipv Vaccine   . Flexeril [Cyclobenzaprine]    Past Medical History:  Diagnosis Date  . GERD (gastroesophageal reflux disease)   . Hypertension   . Palpitations   . Poor sleep hygiene    Health Maintenance  Topic Date Due  . TETANUS/TDAP  01/03/1981  . PAP SMEAR  01/04/1983  . COLONOSCOPY  01/04/2012  . INFLUENZA VACCINE  11/18/2015  . MAMMOGRAM  04/08/2016  . Hepatitis C Screening  Completed  . HIV Screening  Completed   Immunization History  Administered Date(s) Administered  . PPD Test 11/19/2013, 11/25/2014  . Pneumococcal Polysaccharide-23 10/29/2008   Past Surgical History:  Procedure Laterality Date  . SQUINT REPAIR Right 1974   Family History  Problem Relation Age of Onset  . Cancer Mother   . COPD Father    Social History  Substance Use Topics  . Smoking status: Never  Smoker  . Smokeless tobacco: Not on file  . Alcohol use 6.0 oz/week    10 Standard drinks or equivalent per week     Comment: wine    ROS Constitutional: Denies fever, chills, weight loss/gain, headaches, insomnia,  night sweats, and change in appetite. Does c/o fatigue. Eyes: Denies redness, blurred vision, diplopia, discharge, itchy, watery eyes.  ENT: Denies discharge, congestion, post nasal drip, epistaxis, sore throat, earache, hearing loss, dental pain, Tinnitus, Vertigo, Sinus pain, snoring.  Cardio: Denies chest pain, palpitations, irregular heartbeat, syncope, dyspnea, diaphoresis, orthopnea, PND, claudication, edema Respiratory: denies cough, dyspnea, DOE, pleurisy, hoarseness, laryngitis, wheezing.  Gastrointestinal: Denies dysphagia,  heartburn, reflux, water brash, pain, cramps, nausea, vomiting, bloating, diarrhea, constipation, hematemesis, melena, hematochezia, jaundice, hemorrhoids Genitourinary: Denies dysuria, frequency, urgency, nocturia, hesitancy, discharge, hematuria, flank pain Breast: Breast lumps, nipple discharge, bleeding.  Musculoskeletal: Denies arthralgia, myalgia, stiffness, Jt. Swelling, pain, limp, and strain/sprain. Denies falls. Skin: Denies puritis, rash, hives, warts, acne, eczema, changing in skin lesion Neuro: No weakness, tremor, incoordination, spasms, paresthesia, pain Psychiatric: Denies confusion, memory loss, sensory loss. Denies Depression. Endocrine: Denies change in weight, skin, hair change, nocturia, and paresthesia, diabetic polys, visual blurring, hyper / hypo glycemic episodes.  Heme/Lymph: No excessive bleeding, bruising, enlarged lymph nodes.  Physical Exam  BP 112/64   Pulse 68   Temp 97.7 F (36.5 C)   Resp 16   Ht 5\' 1"  (1.549 m)   Wt 112 lb (50.8 kg)   BMI 21.16 kg/m   General Appearance: Well nourished and in no apparent distress.  Eyes: PERRLA, EOMs, conjunctiva no swelling or erythema, normal fundi and vessels. Sinuses: No frontal/maxillary tenderness ENT/Mouth: EACs patent / TMs  nl. Nares clear without erythema, swelling, mucoid exudates. Oral hygiene is good. No erythema, swelling, or exudate. Tongue normal, non-obstructing. Tonsils not swollen or erythematous. Hearing normal.  Neck: Supple, thyroid normal. No bruits, nodes or JVD. Respiratory: Respiratory effort normal.  BS equal and clear bilateral without rales, rhonci, wheezing or stridor. Cardio: Heart sounds are normal with regular rate and rhythm and no murmurs, rubs or gallops. Peripheral pulses are normal and equal bilaterally without edema. No aortic or femoral bruits. Chest: symmetric with normal excursions and percussion. Breasts: Deferred to GYN - Dr Verlon Au office where MGM's are done  Abdomen:  Flat, soft with bowel sounds active. Nontender, no guarding, rebound, hernias, masses, or organomegaly.  Lymphatics: Non tender without lymphadenopathy.  Genitourinary: Deferred to GYNMusculoskeletal: Full ROM all peripheral extremities, joint stability, 5/5 strength, and normal gait. Skin: Warm and dry without rashes, lesions, cyanosis, clubbing or  ecchymosis.  Neuro: Cranial nerves intact, reflexes equal bilaterally. Normal muscle tone, no cerebellar symptoms. Sensation intact.  Pysch: Alert and oriented X 3, normal affect, Insight and Judgment appropriate.   Assessment and Plan  1. Annual Preventative Screening Examination  - POC Hemoccult Bld/Stl  - Urinalysis, Routine w reflex microscopic - CBC with Differential/Platelet - Hepatic function panel - Lipid panel - Hemoglobin A1c - VITAMIN D 25 Hydroxy   2. Essential hypertension   3. Abnormal cholesterol test  - Lipid panel  4. Abnormal glucose  - Hemoglobin A1c  5. Vitamin D deficiency  - VITAMIN D 25 Hydroxy   6. Palpitations, hx/o - remote   7. Screening for rectal cancer  - POC Hemoccult Bld/Stl   8. Screening examination for pulmonary tuberculosis  - deferred by patient due to high deductible  9. Screening for ischemic heart disease  - deferred by patient due to  high deductible  10. Screening for AAA (aortic abdominal aneurysm)  - deferred by patient due to high deductible  11. Other fatigue  - CBC with Differential/Platelet  12. Medication management  - Urinalysis, Routine w reflex microscopic  - CBC with Differential/Platelet - Hepatic function panel      Continue prudent diet as discussed, weight control, BP monitoring, regular exercise, and medications. Discussed med's effects and SE's. Screening labs and tests as requested with regular follow-up as recommended. Over 40 minutes of exam, counseling, chart review and high complex critical decision making was performed.

## 2016-02-14 LAB — URINALYSIS, ROUTINE W REFLEX MICROSCOPIC
BILIRUBIN URINE: NEGATIVE
Glucose, UA: NEGATIVE
HGB URINE DIPSTICK: NEGATIVE
Ketones, ur: NEGATIVE
Leukocytes, UA: NEGATIVE
NITRITE: NEGATIVE
PROTEIN: NEGATIVE
SPECIFIC GRAVITY, URINE: 1.005 (ref 1.001–1.035)
pH: 7 (ref 5.0–8.0)

## 2016-02-14 LAB — VITAMIN D 25 HYDROXY (VIT D DEFICIENCY, FRACTURES): Vit D, 25-Hydroxy: 79 ng/mL (ref 30–100)

## 2016-02-16 ENCOUNTER — Encounter: Payer: Self-pay | Admitting: *Deleted

## 2016-02-18 ENCOUNTER — Other Ambulatory Visit: Payer: Self-pay | Admitting: Internal Medicine

## 2016-02-18 MED ORDER — ALPRAZOLAM 1 MG PO TABS: ORAL_TABLET | ORAL | 5 refills | Status: DC

## 2016-02-18 MED ORDER — BUPROPION HCL ER (XL) 300 MG PO TB24: 300.0000 mg | ORAL_TABLET | Freq: Every day | ORAL | 1 refills | Status: DC

## 2016-04-22 DIAGNOSIS — R87612 Low grade squamous intraepithelial lesion on cytologic smear of cervix (LGSIL): Secondary | ICD-10-CM | POA: Diagnosis not present

## 2016-04-22 DIAGNOSIS — N950 Postmenopausal bleeding: Secondary | ICD-10-CM | POA: Diagnosis not present

## 2016-04-22 DIAGNOSIS — N390 Urinary tract infection, site not specified: Secondary | ICD-10-CM | POA: Diagnosis not present

## 2016-04-22 DIAGNOSIS — Z6821 Body mass index (BMI) 21.0-21.9, adult: Secondary | ICD-10-CM | POA: Diagnosis not present

## 2016-04-22 DIAGNOSIS — Z01419 Encounter for gynecological examination (general) (routine) without abnormal findings: Secondary | ICD-10-CM | POA: Diagnosis not present

## 2016-05-21 ENCOUNTER — Other Ambulatory Visit: Payer: Self-pay

## 2016-05-21 DIAGNOSIS — Z1212 Encounter for screening for malignant neoplasm of rectum: Secondary | ICD-10-CM

## 2016-05-21 DIAGNOSIS — Z0001 Encounter for general adult medical examination with abnormal findings: Secondary | ICD-10-CM

## 2016-05-21 LAB — POC HEMOCCULT BLD/STL (HOME/3-CARD/SCREEN)
FECAL OCCULT BLD: NEGATIVE
FECAL OCCULT BLD: NEGATIVE
Fecal Occult Blood, POC: NEGATIVE

## 2016-06-14 ENCOUNTER — Other Ambulatory Visit: Payer: Self-pay | Admitting: *Deleted

## 2016-06-14 MED ORDER — BUPROPION HCL ER (XL) 300 MG PO TB24: 300.0000 mg | ORAL_TABLET | Freq: Every day | ORAL | 1 refills | Status: DC

## 2016-06-15 DIAGNOSIS — N950 Postmenopausal bleeding: Secondary | ICD-10-CM | POA: Diagnosis not present

## 2016-08-12 DIAGNOSIS — D3709 Neoplasm of uncertain behavior of other specified sites of the oral cavity: Secondary | ICD-10-CM | POA: Diagnosis not present

## 2016-08-19 DIAGNOSIS — K099 Cyst of oral region, unspecified: Secondary | ICD-10-CM | POA: Diagnosis not present

## 2017-01-17 DIAGNOSIS — N950 Postmenopausal bleeding: Secondary | ICD-10-CM | POA: Diagnosis not present

## 2017-02-01 ENCOUNTER — Encounter: Payer: Self-pay | Admitting: Internal Medicine

## 2017-03-01 ENCOUNTER — Other Ambulatory Visit: Payer: Self-pay | Admitting: Internal Medicine

## 2017-03-01 MED ORDER — ALPRAZOLAM 1 MG PO TABS: ORAL_TABLET | ORAL | 0 refills | Status: DC

## 2017-03-03 ENCOUNTER — Other Ambulatory Visit: Payer: Self-pay

## 2017-03-03 MED ORDER — TRETINOIN 0.1 % EX CREA: TOPICAL_CREAM | Freq: Every day | CUTANEOUS | 3 refills | Status: DC

## 2017-03-07 ENCOUNTER — Encounter: Payer: Self-pay | Admitting: *Deleted

## 2017-03-07 DIAGNOSIS — L709 Acne, unspecified: Secondary | ICD-10-CM | POA: Insufficient documentation

## 2017-03-20 NOTE — Patient Instructions (Signed)

## 2017-03-20 NOTE — Progress Notes (Signed)
[facility] Unk Pinto, M.D.     Uvaldo Bristle. Silverio Lay, P.A.-C Liane Comber, [facility] [address] [place], [place] [address] Telephone [phone] Telefax [phone] Annual Screening/Preventative Visit & Comprehensive Evaluation &  Examination     This very nice 56 y.o. [facility] presents for a Screening/Preventative Visit & comprehensive evaluation and management of multiple medical co-morbidities.  Patient has been followed expectantly  for HTN, Prediabetes, Hyperlipidemia and Vitamin D Deficiency.     Patient has hx/o labile  HTN predates since 2005 and she has been followed expectantly since that time. Patient's BP has been controlled at home and patient denies any cardiac symptoms as chest pain, palpitations, shortness of breath, dizziness or ankle swelling. Today's BP is at goal -  120/76.      Patient's lipids are controlled with diet. Last lipids were at goal: Lab Results  Component Value Date   CHOL 186 02/13/2016   HDL 93 02/13/2016   LDLCALC 85 02/13/2016   TRIG 42 02/13/2016   CHOLHDL 2.0 02/13/2016      Patient is monitored proactively for abnormal glucoses and s  Prediabetes.  She denies reactive hypoglycemic symptoms, visual blurring, diabetic polys, or paresthesias. Last A1c was normal & at goal: Lab Results  Component Value Date   HGBA1C 4.6 02/13/2016      Finally, patient has history of Vitamin D Deficiency and last Vitamin D was at goal: Lab Results  Component Value Date   VD25OH 79 02/13/2016   Current Outpatient Medications on File Prior to Visit  Medication Sig  . ALPRAZolam (XANAX) 1 MG tablet TAKE ONE-HALF TO ONE TABLET BY MOUTH THREE TIMES DAILY AS NEEDED FOR ANXIETY  . buPROPion (WELLBUTRIN XL) 300 MG 24 hr tablet TAKE 1 TABLET BY MOUTH ONCE DAILY  . Cholecalciferol (VITAMIN D PO) Take 5,000 Units by mouth 2 (two) times daily.  . fluticasone (FLONASE) 50 MCG/ACT nasal spray  USE TWO SPRAY(S) IN EACH NOSTRIL ONCE DAILY  . loratadine (CLARITIN) 10 MG tablet Take 10 mg by mouth daily as needed for allergies.  . mupirocin ointment (BACTROBAN) 2 % Place 1 application into the nose 2 (two) times daily.  Marland Kitchen OVER THE COUNTER MEDICATION Takes Zinc daily  . OVER THE COUNTER MEDICATION Tumeric daily  . progesterone (PROMETRIUM) 100 MG capsule Take by mouth.  . tretinoin (RETIN-A) 0.1 % cream Apply at bedtime topically.  Marland Kitchen aspirin 81 MG tablet Take 81 mg by mouth daily.  Marland Kitchen DIVIGEL 1 MG/GM GEL Apply daily   No current facility-administered medications on file prior to visit.    Allergies  Allergen Reactions  . Dtap-Ipv Vaccine   . Flexeril [Cyclobenzaprine]    Past Medical History:  Diagnosis Date  . GERD (gastroesophageal reflux disease)   . Hypertension   . Palpitations   . Poor sleep hygiene    Health Maintenance  Topic Date Due  . TETANUS/TDAP  01/03/1981  . PAP SMEAR  01/04/1983  . COLONOSCOPY  01/04/2012  . MAMMOGRAM  04/08/2016  . INFLUENZA VACCINE  11/17/2016  . Hepatitis C Screening  Completed  . HIV Screening  Completed   Immunization History  Administered Date(s) Administered  . PPD Test 11/19/2013, 11/25/2014  . Pneumococcal Polysaccharide-23 10/29/2008   Past Surgical History:  Procedure Laterality Date  . SQUINT REPAIR Right 1974   Family History  Problem Relation Age of Onset  . Cancer Mother   . COPD Father    Social History  Tobacco Use  . Smoking status: Never Smoker  Substance Use Topics  . Alcohol use: Yes    Alcohol/week: 6.0 oz    Types: 10 Standard drinks or equivalent per week    Comment: wine  . Drug use: No    ROS Constitutional: Denies fever, chills, weight loss/gain, headaches, insomnia,  night sweats, and change in appetite. Does c/o fatigue. Eyes: Denies redness, blurred vision, diplopia, discharge, itchy, watery eyes.  ENT: Denies discharge, congestion, post nasal drip, epistaxis, sore throat, earache,  hearing loss, dental pain, Tinnitus, Vertigo, Sinus pain, snoring.  Cardio: Denies chest pain, palpitations, irregular heartbeat, syncope, dyspnea, diaphoresis, orthopnea, PND, claudication, edema Respiratory: denies cough, dyspnea, DOE, pleurisy, hoarseness, laryngitis, wheezing.  Gastrointestinal: Denies dysphagia, heartburn, reflux, water brash, pain, cramps, nausea, vomiting, bloating, diarrhea, constipation, hematemesis, melena, hematochezia, jaundice, hemorrhoids Genitourinary: Denies dysuria, frequency, urgency, nocturia, hesitancy, discharge, hematuria, flank pain Breast: Breast lumps, nipple discharge, bleeding.  Musculoskeletal: Denies arthralgia, myalgia, stiffness, Jt. Swelling, pain, limp, and strain/sprain. Denies falls. Skin: Denies puritis, rash, hives, warts, acne, eczema, changing in skin lesion Neuro: No weakness, tremor, incoordination, spasms, paresthesia, pain Psychiatric: Denies confusion, memory loss, sensory loss. Denies Depression. Endocrine: Denies change in weight, skin, hair change, nocturia, and paresthesia, diabetic polys, visual blurring, hyper / hypo glycemic episodes.  Heme/Lymph: No excessive bleeding, bruising, enlarged lymph nodes.  Physical Exam  BP 120/76   Pulse 72   Temp (!) 97.4 F (36.3 C)   Resp 18   Ht 5\' 1"  (1.549 m)   Wt 116 lb 12.8 oz (53 kg)   BMI 22.07 kg/m   General Appearance: Well nourished, well groomed and in no apparent distress.  Eyes: PERRLA, EOMs, conjunctiva no swelling or erythema, normal fundi and vessels. Sinuses: No frontal/maxillary tenderness ENT/Mouth: EACs patent / TMs  nl. Nares clear without erythema, swelling, mucoid exudates. Oral hygiene is good. No erythema, swelling, or exudate. Tongue normal, non-obstructing. Tonsils not swollen or erythematous. Hearing normal.  Neck: Supple, thyroid normal. No bruits, nodes or JVD. Respiratory: Respiratory effort normal.  BS equal and clear bilateral without rales, rhonci,  wheezing or stridor. Cardio: Heart sounds are normal with regular rate and rhythm and no murmurs, rubs or gallops. Peripheral pulses are normal and equal bilaterally without edema. No aortic or femoral bruits. Chest: symmetric with normal excursions and percussion. Breasts: Symmetric, without lumps, nipple discharge, retractions, or fibrocystic changes.  Abdomen: Flat, soft with bowel sounds active. Nontender, no guarding, rebound, hernias, masses, or organomegaly.  Lymphatics: Non tender without lymphadenopathy.  Genitourinary:  Musculoskeletal: Full ROM all peripheral extremities, joint stability, 5/5 strength, and normal gait. Skin: Warm and dry without rashes, lesions, cyanosis, clubbing or  ecchymosis.  Neuro: Cranial nerves intact, reflexes equal bilaterally. Normal muscle tone, no cerebellar symptoms. Sensation intact.  Pysch: Alert and oriented X 3, normal affect, Insight and Judgment appropriate.   Assessment and Plan  1. Annual Preventative Screening Examination  2. Labile hypertension  - EKG 12-Lead - [place], RETROPERITNL ABD,  LTD - Urinalysis, Routine w reflex microscopic - Microalbumin / creatinine urine ratio - CBC with Differential/Platelet - BASIC METABOLIC PANEL WITH GFR - Magnesium - TSH  3. Screening cholesterol level  - EKG 12-Lead - [place], RETROPERITNL ABD,  LTD - Hepatic function panel - Lipid panel - TSH  4. Abnormal glucose  - EKG 12-Lead - [place], RETROPERITNL ABD,  LTD - Hemoglobin A1c - Insulin, random  5. Vitamin D deficiency  - VITAMIN D 25 Hydroxy   6. Screening  for colorectal cancer  - POC Hemoccult Bld/Stl  7. Screening for ischemic heart disease  - EKG 12-Lead  8. Screening for AAA (aortic abdominal aneurysm)  - [place], RETROPERITNL ABD,  LTD  9. Fatigue, unspecified type  - Iron,Total/Total Iron Binding Cap - Vitamin B12 - CBC with Differential/Platelet - TSH  10. Medication management  - Urinalysis, Routine w reflex  microscopic - Microalbumin / creatinine urine ratio - CBC with Differential/Platelet - BASIC METABOLIC PANEL WITH GFR - Hepatic function panel - Magnesium - Lipid panel - TSH - Hemoglobin A1c - Insulin, random - VITAMIN D 25 Hydroxy         Patient was counseled in prudent diet to achieve/maintain BMI less than 25 for weight control, BP monitoring, regular exercise and medications. Discussed med's effects and SE's. Screening labs and tests as requested with regular follow-up as recommended. Over 40 minutes of exam, counseling, chart review and high complex critical decision making was performed.

## 2017-03-21 ENCOUNTER — Encounter: Payer: Self-pay | Admitting: Internal Medicine

## 2017-03-21 ENCOUNTER — Ambulatory Visit (INDEPENDENT_AMBULATORY_CARE_PROVIDER_SITE_OTHER): Payer: BLUE CROSS/BLUE SHIELD | Admitting: Internal Medicine

## 2017-03-21 VITALS — BP 120/76 | HR 72 | Temp 97.4°F | Resp 18 | Ht 61.0 in | Wt 116.8 lb

## 2017-03-21 DIAGNOSIS — Z13220 Encounter for screening for lipoid disorders: Secondary | ICD-10-CM

## 2017-03-21 DIAGNOSIS — I10 Essential (primary) hypertension: Secondary | ICD-10-CM | POA: Diagnosis not present

## 2017-03-21 DIAGNOSIS — E559 Vitamin D deficiency, unspecified: Secondary | ICD-10-CM | POA: Diagnosis not present

## 2017-03-21 DIAGNOSIS — Z79899 Other long term (current) drug therapy: Secondary | ICD-10-CM | POA: Diagnosis not present

## 2017-03-21 DIAGNOSIS — Z0000 Encounter for general adult medical examination without abnormal findings: Secondary | ICD-10-CM

## 2017-03-21 DIAGNOSIS — Z136 Encounter for screening for cardiovascular disorders: Secondary | ICD-10-CM | POA: Diagnosis not present

## 2017-03-21 DIAGNOSIS — R0989 Other specified symptoms and signs involving the circulatory and respiratory systems: Secondary | ICD-10-CM

## 2017-03-21 DIAGNOSIS — Z1211 Encounter for screening for malignant neoplasm of colon: Secondary | ICD-10-CM

## 2017-03-21 DIAGNOSIS — R5383 Other fatigue: Secondary | ICD-10-CM

## 2017-03-21 DIAGNOSIS — Z0001 Encounter for general adult medical examination with abnormal findings: Secondary | ICD-10-CM

## 2017-03-21 DIAGNOSIS — R7309 Other abnormal glucose: Secondary | ICD-10-CM

## 2017-03-21 DIAGNOSIS — Z1212 Encounter for screening for malignant neoplasm of rectum: Secondary | ICD-10-CM

## 2017-03-22 ENCOUNTER — Encounter: Payer: Self-pay | Admitting: *Deleted

## 2017-03-22 LAB — HEPATIC FUNCTION PANEL
AG Ratio: 2.2 (calc) (ref 1.0–2.5)
ALBUMIN MSPROF: 4.6 g/dL (ref 3.6–5.1)
ALKALINE PHOSPHATASE (APISO): 47 U/L (ref 33–130)
ALT: 20 U/L (ref 6–29)
AST: 25 U/L (ref 10–35)
Bilirubin, Direct: 0.1 mg/dL (ref 0.0–0.2)
Globulin: 2.1 g/dL (calc) (ref 1.9–3.7)
Indirect Bilirubin: 0.3 mg/dL (calc) (ref 0.2–1.2)
TOTAL PROTEIN: 6.7 g/dL (ref 6.1–8.1)
Total Bilirubin: 0.4 mg/dL (ref 0.2–1.2)

## 2017-03-22 LAB — BASIC METABOLIC PANEL WITH GFR
BUN: 18 mg/dL (ref 7–25)
CALCIUM: 9.7 mg/dL (ref 8.6–10.4)
CO2: 29 mmol/L (ref 20–32)
CREATININE: 0.85 mg/dL (ref 0.50–1.05)
Chloride: 107 mmol/L (ref 98–110)
GFR, Est African American: 89 mL/min/{1.73_m2} (ref >=60)
GFR, Est Non African American: 77 mL/min/{1.73_m2} (ref >=60)
GLUCOSE: 88 mg/dL (ref 65–99)
POTASSIUM: 4.7 mmol/L (ref 3.5–5.3)
SODIUM: 142 mmol/L (ref 135–146)

## 2017-03-22 LAB — MICROALBUMIN / CREATININE URINE RATIO
CREATININE, URINE: 24 mg/dL (ref 20–275)
MICROALB UR: 0.5 mg/dL
Microalb Creat Ratio: 21 mcg/mg creat (ref <30)

## 2017-03-22 LAB — CBC WITH DIFFERENTIAL/PLATELET
BASOS ABS: 18 {cells}/uL (ref 0–200)
Basophils Relative: 0.4 %
EOS ABS: 41 {cells}/uL (ref 15–500)
Eosinophils Relative: 0.9 %
HCT: 41.3 % (ref 35.0–45.0)
HEMOGLOBIN: 13.9 g/dL (ref 11.7–15.5)
Lymphs Abs: 1454 cells/uL (ref 850–3900)
MCH: 31 pg (ref 27.0–33.0)
MCHC: 33.7 g/dL (ref 32.0–36.0)
MCV: 92 fL (ref 80.0–100.0)
MONOS PCT: 11.4 %
MPV: 9.8 fL (ref 7.5–12.5)
Neutro Abs: 2562 cells/uL (ref 1500–7800)
Neutrophils Relative %: 55.7 %
PLATELETS: 232 10*3/uL (ref 140–400)
RBC: 4.49 10*6/uL (ref 3.80–5.10)
RDW: 12.3 % (ref 11.0–15.0)
TOTAL LYMPHOCYTE: 31.6 %
WBC: 4.6 10*3/uL (ref 3.8–10.8)
WBCMIX: 524 {cells}/uL (ref 200–950)

## 2017-03-22 LAB — URINALYSIS, ROUTINE W REFLEX MICROSCOPIC
Bilirubin Urine: NEGATIVE
Glucose, UA: NEGATIVE
Hgb urine dipstick: NEGATIVE
Ketones, ur: NEGATIVE
LEUKOCYTES UA: NEGATIVE
NITRITE: NEGATIVE
PROTEIN: NEGATIVE
SPECIFIC GRAVITY, URINE: 1.008 (ref 1.001–1.03)
pH: 7 (ref 5.0–8.0)

## 2017-03-22 LAB — MAGNESIUM: Magnesium: 1.9 mg/dL (ref 1.5–2.5)

## 2017-03-22 LAB — IRON, TOTAL/TOTAL IRON BINDING CAP
%SAT: 29 % (calc) (ref 11–50)
IRON: 86 ug/dL (ref 45–160)
TIBC: 293 ug/dL (ref 250–450)

## 2017-03-22 LAB — HEMOGLOBIN A1C
EAG (MMOL/L): 4.9 (calc)
Hgb A1c MFr Bld: 4.7 % of total Hgb (ref <5.7)
Mean Plasma Glucose: 88 (calc)

## 2017-03-22 LAB — LIPID PANEL
CHOLESTEROL: 193 mg/dL (ref <200)
HDL: 91 mg/dL (ref >50)
LDL Cholesterol (Calc): 88 mg/dL (calc)
Non-HDL Cholesterol (Calc): 102 mg/dL (calc) (ref <130)
Total CHOL/HDL Ratio: 2.1 (calc) (ref <5.0)
Triglycerides: 47 mg/dL (ref <150)

## 2017-03-22 LAB — VITAMIN B12: Vitamin B-12: 634 pg/mL (ref 200–1100)

## 2017-03-22 LAB — VITAMIN D 25 HYDROXY (VIT D DEFICIENCY, FRACTURES): VIT D 25 HYDROXY: 103 ng/mL — AB (ref 30–100)

## 2017-03-22 LAB — INSULIN, RANDOM: INSULIN: 3.8 u[IU]/mL (ref 2.0–19.6)

## 2017-03-22 LAB — TSH: TSH: 1.82 m[IU]/L

## 2017-05-10 DIAGNOSIS — D487 Neoplasm of uncertain behavior of other specified sites: Secondary | ICD-10-CM | POA: Diagnosis not present

## 2017-06-01 ENCOUNTER — Other Ambulatory Visit: Payer: Self-pay | Admitting: Internal Medicine

## 2017-07-11 ENCOUNTER — Other Ambulatory Visit: Payer: Self-pay | Admitting: Internal Medicine

## 2017-07-11 DIAGNOSIS — Z1212 Encounter for screening for malignant neoplasm of rectum: Secondary | ICD-10-CM | POA: Diagnosis not present

## 2017-07-11 DIAGNOSIS — Z1211 Encounter for screening for malignant neoplasm of colon: Secondary | ICD-10-CM | POA: Diagnosis not present

## 2017-07-11 MED ORDER — IPRATROPIUM BROMIDE 0.06 % NA SOLN: NASAL | 3 refills | Status: DC

## 2017-07-18 LAB — COLOGUARD: COLOGUARD: NEGATIVE

## 2017-07-19 ENCOUNTER — Other Ambulatory Visit: Payer: Self-pay | Admitting: *Deleted

## 2017-07-19 ENCOUNTER — Encounter: Payer: Self-pay | Admitting: *Deleted

## 2017-07-19 MED ORDER — IPRATROPIUM BROMIDE 0.06 % NA SOLN: NASAL | 3 refills | Status: DC

## 2017-07-25 ENCOUNTER — Other Ambulatory Visit: Payer: Self-pay | Admitting: Internal Medicine

## 2017-07-25 MED ORDER — ALPRAZOLAM 1 MG PO TABS: ORAL_TABLET | ORAL | 0 refills | Status: DC

## 2017-08-03 DIAGNOSIS — Z6821 Body mass index (BMI) 21.0-21.9, adult: Secondary | ICD-10-CM | POA: Diagnosis not present

## 2017-08-03 DIAGNOSIS — Z01419 Encounter for gynecological examination (general) (routine) without abnormal findings: Secondary | ICD-10-CM | POA: Diagnosis not present

## 2017-08-09 ENCOUNTER — Other Ambulatory Visit: Payer: Self-pay | Admitting: Obstetrics & Gynecology

## 2017-08-12 DIAGNOSIS — H2513 Age-related nuclear cataract, bilateral: Secondary | ICD-10-CM | POA: Diagnosis not present

## 2017-08-12 DIAGNOSIS — H53031 Strabismic amblyopia, right eye: Secondary | ICD-10-CM | POA: Diagnosis not present

## 2017-08-25 ENCOUNTER — Other Ambulatory Visit: Payer: Self-pay | Admitting: Physician Assistant

## 2017-09-13 ENCOUNTER — Other Ambulatory Visit: Payer: Self-pay | Admitting: Obstetrics & Gynecology

## 2017-09-13 DIAGNOSIS — R599 Enlarged lymph nodes, unspecified: Secondary | ICD-10-CM

## 2017-09-19 ENCOUNTER — Ambulatory Visit
Admission: RE | Admit: 2017-09-19 | Discharge: 2017-09-19 | Disposition: A | Discharge status: Home / Self Care | Payer: BLUE CROSS/BLUE SHIELD | Source: Ambulatory Visit | Attending: Obstetrics & Gynecology | Admitting: Obstetrics & Gynecology

## 2017-09-19 DIAGNOSIS — R599 Enlarged lymph nodes, unspecified: Secondary | ICD-10-CM

## 2017-09-19 DIAGNOSIS — N6320 Unspecified lump in the left breast, unspecified quadrant: Secondary | ICD-10-CM | POA: Diagnosis not present

## 2017-09-19 DIAGNOSIS — R922 Inconclusive mammogram: Secondary | ICD-10-CM | POA: Diagnosis not present

## 2017-10-03 ENCOUNTER — Other Ambulatory Visit: Payer: Self-pay | Admitting: Internal Medicine

## 2017-11-08 ENCOUNTER — Other Ambulatory Visit: Payer: Self-pay | Admitting: Internal Medicine

## 2017-11-08 MED ORDER — ALPRAZOLAM 1 MG PO TABS: ORAL_TABLET | ORAL | 0 refills | Status: DC

## 2017-12-14 ENCOUNTER — Ambulatory Visit (INDEPENDENT_AMBULATORY_CARE_PROVIDER_SITE_OTHER): Payer: BLUE CROSS/BLUE SHIELD | Admitting: Adult Health

## 2017-12-14 ENCOUNTER — Encounter: Payer: Self-pay | Admitting: Adult Health

## 2017-12-14 VITALS — BP 108/76 | HR 104 | Temp 99.0°F | Ht 61.0 in | Wt 116.0 lb

## 2017-12-14 DIAGNOSIS — R1084 Generalized abdominal pain: Secondary | ICD-10-CM

## 2017-12-14 DIAGNOSIS — R110 Nausea: Secondary | ICD-10-CM | POA: Diagnosis not present

## 2017-12-14 DIAGNOSIS — R509 Fever, unspecified: Secondary | ICD-10-CM

## 2017-12-14 DIAGNOSIS — R1011 Right upper quadrant pain: Secondary | ICD-10-CM

## 2017-12-14 MED ORDER — PROMETHAZINE HCL 25 MG PO TABS: 25.0000 mg | ORAL_TABLET | Freq: Four times a day (QID) | ORAL | 0 refills | Status: DC | PRN

## 2017-12-14 MED ORDER — CIPROFLOXACIN HCL 500 MG PO TABS: 500.0000 mg | ORAL_TABLET | Freq: Two times a day (BID) | ORAL | 0 refills | Status: DC

## 2017-12-14 NOTE — Progress Notes (Signed)
Assessment and Plan:  Leah Patrick was seen today for fever, abdominal pain, nausea and fatigue.  Diagnoses and all orders for this visit:  Right upper quadrant abdominal pain/Fever, unspecified fever cause Unclear etiology, epigastric tenderness + fever, radiation to back cannot exclude pancreas, gallbladder, abscess, renal etiology Strict ER precautions tonight, bland clear liquids recommended -     CBC with Differential/Platelet -     COMPLETE METABOLIC PANEL WITH GFR -     Urinalysis w microscopic + reflex cultur -     Amylase -     Lipase -     US Abdomen Complete; Future       -     ciprofloxacin (CIPRO) 500 MG tablet; Take 1 tablet (500 mg total) by mouth 2 (two) times daily.  Nausea -     promethazine (PHENERGAN) 25 MG tablet; Take 1 tablet (25 mg total) by mouth every 6 (six) hours as needed for nausea or vomiting.  Further disposition pending results of labs. Discussed med's effects and SE's.   Over 30 minutes of exam, counseling, chart review, and critical decision making was performed.   Future Appointments  Date Time Provider [place]  03/31/2018 11:00 AM Unk Pinto, MD [id number] None    ------------------------------------------------------------------------------------------------------------------   HPI BP 108/76   Pulse (!) 104   Temp 99 F (37.2 C)   Ht 5\' 1"  (1.549 m)   Wt 116 lb (52.6 kg)   SpO2 96%   BMI 21.92 kg/m   Leah Patrick presents for evaluation of left generalized abdominal pain that began 4 days ago, began as mild discomfort of general left side, with slow build, now is constant, describes as pressure with stabbing of RUQ with deep breath, worse with sitting, improved with supine position. She has had a fever x 3 days, hasn't had appetite, has had some chicken soup, cranberry, has become nauseous without emesis.   She has had a fever as high as 103.9 yesterday evening with chills and diaphoresis. Taking 1000 mg tylenol every 6 hours  which is controlling fever for the most part.   She has tried azo without benefit  Has been on vacation in [place] last week, increased ETOH, no other sick individuals or known contacts  Neg cologard 07/2017, no abd imaging on record for review, no abdominal surgical history  Past Medical History:  Diagnosis Date  . GERD (gastroesophageal reflux disease)   . Hypertension   . Palpitations   . Poor sleep hygiene      Allergies  Allergen Reactions  . Dtap-Ipv Vaccine Hives  . Flexeril [Cyclobenzaprine]     Current Outpatient Medications on File Prior to Visit  Medication Sig  . ALPRAZolam (XANAX) 1 MG tablet Take 1/2 -1 tablet 2-3 x /day ONLY if Acute Anxiety Attack & please try to limit to 5 days /week to avoid addiction- Rx should last >6 weeks  . aspirin 81 MG tablet Take 81 mg by mouth daily.  Marland Kitchen buPROPion (WELLBUTRIN XL) 300 MG 24 hr tablet TAKE 1 TABLET BY MOUTH ONCE DAILY  . Cholecalciferol (VITAMIN D PO) Take 5,000 Units by mouth 2 (two) times daily.  . fluticasone (FLONASE) 50 MCG/ACT nasal spray USE TWO SPRAY(S) IN EACH NOSTRIL ONCE DAILY  . ipratropium (ATROVENT) 0.06 % nasal spray Use 1 to 2 sprays each nostril 3 to 4 x / day  . loratadine (CLARITIN) 10 MG tablet Take 10 mg by mouth daily as needed for allergies.  . mupirocin ointment (BACTROBAN) 2 %  Place 1 application into the nose 2 (two) times daily.  Marland Kitchen OVER THE COUNTER MEDICATION Takes Zinc daily  . OVER THE COUNTER MEDICATION Tumeric daily  . progesterone (PROMETRIUM) 100 MG capsule Take by mouth.  . tretinoin (RETIN-A) 0.1 % cream Apply at bedtime topically.  Marland Kitchen DIVIGEL 1 MG/GM GEL Apply daily   No current facility-administered medications on file prior to visit.     ROS: Review of Systems  Constitutional: Positive for chills, diaphoresis, fever and malaise/fatigue.  HENT: Negative.   Eyes: Negative for blurred vision.  Respiratory: Negative for cough, hemoptysis, sputum production, shortness of breath and  wheezing.   Cardiovascular: Negative for chest pain, palpitations and leg swelling.  Gastrointestinal: Positive for abdominal pain and nausea. Negative for blood in stool, constipation, diarrhea, heartburn, melena and vomiting.  Genitourinary: Negative for dysuria, flank pain, frequency, hematuria and urgency.  Musculoskeletal: Negative for falls, joint pain and myalgias.  Skin: Negative for rash.  Neurological: Positive for headaches (mild, achy). Negative for dizziness, tingling, seizures, loss of consciousness and weakness.  Endo/Heme/Allergies: Does not bruise/bleed easily.     Physical Exam:  BP 108/76   Pulse (!) 104   Temp 99 F (37.2 C)   Ht 5\' 1"  (1.549 m)   Wt 116 lb (52.6 kg)   SpO2 96%   BMI 21.92 kg/m   General Appearance: Well nourished, in no apparent distress. Eyes: PERRLA, EOMs, conjunctiva no swelling or erythema Sinuses: No Frontal/maxillary tenderness ENT/Mouth: Ext aud canals clear, TMs without erythema, bulging. No erythema, swelling, or exudate on post pharynx.  Tonsils not swollen or erythematous. Hearing normal.  Neck: Supple, thyroid normal.  Respiratory: Respiratory effort normal, BS equal bilaterally without rales, rhonchi, wheezing or stridor.  Cardio: RRR with no MRGs. Brisk peripheral pulses without edema.  Abdomen: Soft, + BS.  Epigastric and RUQ tenderness with some radiation to back, no guarding, rebound, palpable hernias, masses. Lymphatics: Non tender without lymphadenopathy.  Musculoskeletal: Symmetrical strength, normal gait.  Skin: Warm, dry without rashes, lesions, ecchymosis.  Neuro: Cranial nerves intact. Normal muscle tone, no cerebellar symptoms. Sensation intact.  Psych: Awake and oriented X 3, normal affect, Insight and Judgment appropriate.     Izora Ribas, NP 4:17 PM [facility]

## 2017-12-14 NOTE — Patient Instructions (Addendum)
Acute Pancreatitis Acute pancreatitis is a condition in which the pancreas suddenly becomes irritated and swollen (has inflammation). The pancreas is a gland that is located behind the stomach. It produces enzymes that help to digest food. The pancreas also releases the hormones glucagon and insulin, which help to regulate blood sugar. Damage to the pancreas occurs when the digestive enzymes from the pancreas are activated before they are released into the intestine. Most acute attacks last a couple of days and can cause serious problems. Some people become dehydrated and develop low blood pressure. In severe cases, bleeding into the pancreas can lead to shock and can be life-threatening. The lungs, heart, and kidneys may fail. What are the causes? The most common causes of this condition are:  Alcohol abuse.  Gallstones.  Other causes include:  Certain medicines.  Exposure to certain chemicals.  Infection.  Damage caused by an accident (trauma).  Abdominal surgery.  In some cases, the cause may not be known. What are the signs or symptoms? Symptoms of this condition include:  Pain in the upper abdomen that may radiate to the back.  Tenderness and swelling of the abdomen.  Nausea and vomiting.  How is this diagnosed? This condition may be diagnosed based on:  A physical exam.  Blood tests.  Imaging tests, such as X-rays, CT scans, or an ultrasound of the abdomen.  How is this treated? Treatment for this condition usually requires a stay in the hospital. Treatment may include:  Pain medicine.  Fluid replacement through an IV tube.  Placing a tube in the stomach to remove stomach contents and to control vomiting (NG tube, or nasogastric tube).  Not eating for 3-4 days. This gives the pancreas a rest, because enzymes are not being produced that can cause further damage.  Antibiotic medicines, if your condition is caused by an infection.  Surgery on the pancreas or  gallbladder.  Follow these instructions at home: Eating and drinking  Follow instructions from your health care provider about diet. This may involve avoiding alcohol and decreasing the amount of fat in your diet.  Eat smaller, more frequent meals. This reduces the amount of digestive fluids that the pancreas produces.  Drink enough fluid to keep your urine clear or pale yellow.  Do not drink alcohol if it caused your condition. General instructions  Take over-the-counter and prescription medicines only as told by your health care provider.  Do not use any tobacco products, such as cigarettes, chewing tobacco, and e-cigarettes. If you need help quitting, ask your health care provider.  Get plenty of rest.  If directed, check your blood sugar at home as told by your health care provider.  Keep all follow-up visits as told by your health care provider. This is important. Contact a health care provider if:  You do not recover as quickly as expected.  You develop new or worsening symptoms.  You have persistent pain, weakness, or nausea.  You recover and then have another episode of pain.  You have a fever. Get help right away if:  You cannot eat or keep fluids down.  Your pain becomes severe.  Your skin or the white part of your eyes turns yellow (jaundice).  You vomit.  You feel dizzy or you faint.  Your blood sugar is high (over 300 mg/dL). This information is not intended to replace advice given to you by your health care provider. Make sure you discuss any questions you have with your health care provider. Document Released:  04/05/2005 Document Revised: 08/13/2015 Document Reviewed: 01/07/2015 Elsevier Interactive Patient Education  2018 [employer].    Ciprofloxacin tablets What is this medicine? CIPROFLOXACIN (sip roe FLOX a sin) is a quinolone antibiotic. It is used to treat certain kinds of bacterial infections. It will not work for colds, flu, or other  viral infections. This medicine may be used for other purposes; ask your health care provider or pharmacist if you have questions. COMMON BRAND NAME(S): Cipro What should I tell my health care provider before I take this medicine? They need to know if you have any of these conditions: -bone problems -history of low levels of potassium in the blood -joint problems -irregular heartbeat -kidney disease -myasthenia gravis -seizures -tendon problems -tingling of the fingers or toes, or other nerve disorder -an unusual or allergic reaction to ciprofloxacin, other antibiotics or medicines, foods, dyes, or preservatives -pregnant or trying to get pregnant -breast-feeding How should I use this medicine? Take this medicine by mouth with a glass of water. Follow the directions on the prescription label. Take your medicine at regular intervals. Do not take your medicine more often than directed. Take all of your medicine as directed even if you think your are better. Do not skip doses or stop your medicine early. You can take this medicine with food or on an empty stomach. It can be taken with a meal that contains dairy or calcium, but do not take it alone with a dairy product, like milk or yogurt or calcium-fortified juice. A special MedGuide will be given to you by the pharmacist with each prescription and refill. Be sure to read this information carefully each time. Talk to your pediatrician regarding the use of this medicine in children. Special care may be needed. Overdosage: If you think you have taken too much of this medicine contact a poison control center or emergency room at once. NOTE: This medicine is only for you. Do not share this medicine with others. What if I miss a dose? If you miss a dose, take it as soon as you can. If it is almost time for your next dose, take only that dose. Do not take double or extra doses. What may interact with this medicine? Do not take this medicine with  any of the following medications: -cisapride -dofetilide -dronedarone -flibanserin -lomitapide -pimozide -thioridazine -tizanidine -ziprasidone This medicine may also interact with the following medications: -antacids -birth control pills -caffeine -certain medicines for diabetes, like glipizide or glyburide -certain medicines that treat or prevent blood clots like warfarin -clozapine -cyclosporine -didanosine (ddI) buffered tablets or powder -duloxetine -lanthanum carbonate -lidocaine -methotrexate -multivitamins -NSAIDS, medicines for pain and inflammation, like ibuprofen or naproxen -olanzapine -omeprazole -other medicines that prolong the QT interval (cause an abnormal heart rhythm) -phenytoin -probenecid -ropinirole -sevelamer -sildenafil -sucralfate -theophylline -zolpidem This list may not describe all possible interactions. Give your health care provider a list of all the medicines, herbs, non-prescription drugs, or dietary supplements you use. Also tell them if you smoke, drink alcohol, or use illegal drugs. Some items may interact with your medicine. What should I watch for while using this medicine? Tell your doctor or health care professional if your symptoms do not improve. Do not treat diarrhea with over the counter products. Contact your doctor if you have diarrhea that lasts more than 2 days or if it is severe and watery. You may get drowsy or dizzy. Do not drive, use machinery, or do anything that needs mental alertness until you know how this  medicine affects you. Do not stand or sit up quickly, especially if you are an older patient. This reduces the risk of dizzy or fainting spells. This medicine can make you more sensitive to the sun. Keep out of the sun. If you cannot avoid being in the sun, wear protective clothing and use sunscreen. Do not use sun lamps or tanning beds/booths. Avoid antacids, aluminum, calcium, iron, magnesium, and zinc products for 6  hours before and 2 hours after taking a dose of this medicine. What side effects may I notice from receiving this medicine? Side effects that you should report to your doctor or health care professional as soon as possible: -allergic reactions like skin rash or hives, swelling of the face, lips, or tongue -anxious -confusion -depressed mood -diarrhea -fast, irregular heartbeat -hallucination, loss of contact with reality -joint, muscle, or tendon pain or swelling -pain, tingling, numbness in the hands or feet -suicidal thoughts or other mood changes -sunburn -unusually weak or tired Side effects that usually do not require medical attention (report to your doctor or health care professional if they continue or are bothersome): -dry mouth -headache -nausea -trouble sleeping This list may not describe all possible side effects. Call your doctor for medical advice about side effects. You may report side effects to FDA at 1-800-FDA-1088. Where should I keep my medicine? Keep out of the reach of children. Store at room temperature below 30 degrees C (86 degrees F). Keep container tightly closed. Throw away any unused medicine after the expiration date. NOTE: This sheet is a summary. It may not cover all possible information. If you have questions about this medicine, talk to your doctor, pharmacist, or health care provider.  2018 Elsevier/Gold Standard (2015-11-14 14:42:02)    Promethazine tablets What is this medicine? PROMETHAZINE (proe METH a zeen) is an antihistamine. It is used to treat allergic reactions and to treat or prevent nausea and vomiting from illness or motion sickness. It is also used to make you sleep before surgery, and to help treat pain or nausea after surgery. This medicine may be used for other purposes; ask your health care provider or pharmacist if you have questions. COMMON BRAND NAME(S): Phenergan What should I tell my health care provider before I take this  medicine? They need to know if you have any of these conditions: -glaucoma -high blood pressure or heart disease -kidney disease -liver disease -lung or breathing disease, like asthma -prostate trouble -pain or difficulty passing urine -seizures -an unusual or allergic reaction to promethazine or phenothiazines, other medicines, foods, dyes, or preservatives -pregnant or trying to get pregnant -breast-feeding How should I use this medicine? Take this medicine by mouth with a glass of water. Follow the directions on the prescription label. Take your doses at regular intervals. Do not take your medicine more often than directed. Talk to your pediatrician regarding the use of this medicine in children. Special care may be needed. This medicine should not be given to infants and children younger than 62 years old. Overdosage: If you think you have taken too much of this medicine contact a poison control center or emergency room at once. NOTE: This medicine is only for you. Do not share this medicine with others. What if I miss a dose? If you miss a dose, take it as soon as you can. If it is almost time for your next dose, take only that dose. Do not take double or extra doses. What may interact with this medicine? Do not take  this medicine with any of the following medications: -cisapride -dofetilide -dronedarone -MAOIs like Carbex, Eldepryl, Marplan, Nardil, Parnate -pimozide -quinidine, including dextromethorphan; quinidine -thioridazine -ziprasidone This medicine may also interact with the following medications: -certain medicines for depression, anxiety, or psychotic disturbances -certain medicines for anxiety or sleep -certain medicines for seizures like carbamazepine, phenobarbital, phenytoin -certain medicines for movement abnormalities as in Parkinson's disease, or for gastrointestinal problems -epinephrine -medicines for allergies or colds -muscle relaxants -narcotic  medicines for pain -other medicines that prolong the QT interval (cause an abnormal heart rhythm) -tramadol -trimethobenzamide This list may not describe all possible interactions. Give your health care provider a list of all the medicines, herbs, non-prescription drugs, or dietary supplements you use. Also tell them if you smoke, drink alcohol, or use illegal drugs. Some items may interact with your medicine. What should I watch for while using this medicine? Tell your doctor or health care professional if your symptoms do not start to get better in 1 to 2 days. You may get drowsy or dizzy. Do not drive, use machinery, or do anything that needs mental alertness until you know how this medicine affects you. To reduce the risk of dizzy or fainting spells, do not stand or sit up quickly, especially if you are an older patient. Alcohol may increase dizziness and drowsiness. Avoid alcoholic drinks. Your mouth may get dry. Chewing sugarless gum or sucking hard candy, and drinking plenty of water may help. Contact your doctor if the problem does not go away or is severe. This medicine may cause dry eyes and blurred vision. If you wear contact lenses you may feel some discomfort. Lubricating drops may help. See your eye doctor if the problem does not go away or is severe. This medicine can make you more sensitive to the sun. Keep out of the sun. If you cannot avoid being in the sun, wear protective clothing and use sunscreen. Do not use sun lamps or tanning beds/booths. If you are diabetic, check your blood-sugar levels regularly. What side effects may I notice from receiving this medicine? Side effects that you should report to your doctor or health care professional as soon as possible: -blurred vision -irregular heartbeat, palpitations or chest pain -muscle or facial twitches -pain or difficulty passing urine -seizures -skin rash -slowed or shallow breathing -unusual bleeding or bruising -yellowing  of the eyes or skin Side effects that usually do not require medical attention (report to your doctor or health care professional if they continue or are bothersome): -headache -nightmares, agitation, nervousness, excitability, not able to sleep (these are more likely in children) -stuffy nose This list may not describe all possible side effects. Call your doctor for medical advice about side effects. You may report side effects to FDA at 1-800-FDA-1088. Where should I keep my medicine? Keep out of the reach of children. Store at room temperature, between 20 and 25 degrees C (68 and 77 degrees F). Protect from light. Throw away any unused medicine after the expiration date. NOTE: This sheet is a summary. It may not cover all possible information. If you have questions about this medicine, talk to your doctor, pharmacist, or health care provider.  2018 Elsevier/Gold Standard (2012-12-05 15:04:46)

## 2017-12-16 ENCOUNTER — Other Ambulatory Visit: Payer: Self-pay

## 2017-12-16 ENCOUNTER — Ambulatory Visit (HOSPITAL_COMMUNITY)
Admission: RE | Admit: 2017-12-16 | Discharge: 2017-12-16 | Disposition: A | Discharge status: Home / Self Care | Payer: BLUE CROSS/BLUE SHIELD | Source: Ambulatory Visit | Attending: Adult Health | Admitting: Adult Health

## 2017-12-16 DIAGNOSIS — D259 Leiomyoma of uterus, unspecified: Secondary | ICD-10-CM | POA: Insufficient documentation

## 2017-12-16 DIAGNOSIS — R1084 Generalized abdominal pain: Secondary | ICD-10-CM | POA: Diagnosis not present

## 2017-12-16 DIAGNOSIS — R93422 Abnormal radiologic findings on diagnostic imaging of left kidney: Secondary | ICD-10-CM | POA: Diagnosis not present

## 2017-12-16 LAB — URINE CULTURE
MICRO NUMBER: 91035223
SPECIMEN QUALITY: ADEQUATE

## 2017-12-16 LAB — COMPLETE METABOLIC PANEL WITH GFR
AG RATIO: 1.6 (calc) (ref 1.0–2.5)
ALT: 20 U/L (ref 6–29)
AST: 34 U/L (ref 10–35)
Albumin: 4.1 g/dL (ref 3.6–5.1)
Alkaline phosphatase (APISO): 63 U/L (ref 33–130)
BILIRUBIN TOTAL: 0.7 mg/dL (ref 0.2–1.2)
BUN: 9 mg/dL (ref 7–25)
CHLORIDE: 100 mmol/L (ref 98–110)
CO2: 25 mmol/L (ref 20–32)
Calcium: 9.2 mg/dL (ref 8.6–10.4)
Creat: 1.01 mg/dL (ref 0.50–1.05)
GFR, EST AFRICAN AMERICAN: 73 mL/min/{1.73_m2} (ref >=60)
GFR, Est Non African American: 63 mL/min/{1.73_m2} (ref >=60)
GLOBULIN: 2.5 g/dL (ref 1.9–3.7)
Glucose, Bld: 87 mg/dL (ref 65–99)
Potassium: 3.9 mmol/L (ref 3.5–5.3)
Sodium: 136 mmol/L (ref 135–146)
TOTAL PROTEIN: 6.6 g/dL (ref 6.1–8.1)

## 2017-12-16 LAB — URINALYSIS W MICROSCOPIC + REFLEX CULTURE
BILIRUBIN URINE: NEGATIVE
GLUCOSE, UA: NEGATIVE
HGB URINE DIPSTICK: NEGATIVE
Ketones, ur: NEGATIVE
Nitrites, Initial: POSITIVE — AB
PH: 5.5 (ref 5.0–8.0)
Specific Gravity, Urine: 1.012 (ref 1.001–1.03)

## 2017-12-16 LAB — CBC WITH DIFFERENTIAL/PLATELET
BASOS PCT: 0.2 %
Basophils Absolute: 33 cells/uL (ref 0–200)
EOS ABS: 114 {cells}/uL (ref 15–500)
Eosinophils Relative: 0.7 %
HEMATOCRIT: 38.3 % (ref 35.0–45.0)
HEMOGLOBIN: 13.1 g/dL (ref 11.7–15.5)
Lymphs Abs: 978 cells/uL (ref 850–3900)
MCH: 30.8 pg (ref 27.0–33.0)
MCHC: 34.2 g/dL (ref 32.0–36.0)
MCV: 89.9 fL (ref 80.0–100.0)
MPV: 10.3 fL (ref 7.5–12.5)
Monocytes Relative: 10.2 %
Neutro Abs: 13513 cells/uL — ABNORMAL HIGH (ref 1500–7800)
Neutrophils Relative %: 82.9 %
Platelets: 160 10*3/uL (ref 140–400)
RBC: 4.26 10*6/uL (ref 3.80–5.10)
RDW: 12.4 % (ref 11.0–15.0)
TOTAL LYMPHOCYTE: 6 %
WBC: 16.3 10*3/uL — ABNORMAL HIGH (ref 3.8–10.8)
WBCMIX: 1663 {cells}/uL — AB (ref 200–950)

## 2017-12-16 LAB — LIPASE: Lipase: 14 U/L (ref 7–60)

## 2017-12-16 LAB — AMYLASE: Amylase: 39 U/L (ref 21–101)

## 2017-12-16 LAB — CULTURE INDICATED

## 2017-12-16 MED ORDER — IOPAMIDOL (ISOVUE-300) INJECTION 61%
100.0000 mL | Freq: Once | INTRAVENOUS | Status: AC | PRN
  Administered 2017-12-16: 100 mL via INTRAVENOUS

## 2017-12-16 MED ORDER — IOPAMIDOL (ISOVUE-300) INJECTION 61%
INTRAVENOUS | Status: AC
  Filled 2017-12-16: qty 100

## 2017-12-16 MED ORDER — BARIUM SULFATE 2.1 % PO SUSP
ORAL | Status: AC
  Filled 2017-12-16: qty 1

## 2017-12-16 MED ORDER — IOPAMIDOL (ISOVUE-300) INJECTION 61%
INTRAVENOUS | Status: AC
  Filled 2017-12-16: qty 30

## 2017-12-16 NOTE — Addendum Note (Signed)
Addended by: Izora Ribas on: 12/16/2017 09:39 AM   Modules accepted: Orders

## 2017-12-16 NOTE — Addendum Note (Signed)
Addended by: Izora Ribas on: 12/16/2017 08:52 AM   Modules accepted: Orders

## 2017-12-17 ENCOUNTER — Encounter: Payer: Self-pay | Admitting: Adult Health

## 2017-12-17 DIAGNOSIS — D259 Leiomyoma of uterus, unspecified: Secondary | ICD-10-CM | POA: Insufficient documentation

## 2018-02-17 ENCOUNTER — Other Ambulatory Visit: Payer: Self-pay | Admitting: Internal Medicine

## 2018-02-28 ENCOUNTER — Other Ambulatory Visit: Payer: Self-pay | Admitting: Internal Medicine

## 2018-03-08 ENCOUNTER — Ambulatory Visit (INDEPENDENT_AMBULATORY_CARE_PROVIDER_SITE_OTHER): Payer: Managed Care, Other (non HMO) | Admitting: Internal Medicine

## 2018-03-08 ENCOUNTER — Encounter: Payer: Self-pay | Admitting: Internal Medicine

## 2018-03-08 VITALS — BP 132/90 | HR 76 | Temp 97.5°F | Resp 16 | Ht 61.0 in | Wt 112.0 lb

## 2018-03-08 DIAGNOSIS — I10 Essential (primary) hypertension: Secondary | ICD-10-CM

## 2018-03-08 DIAGNOSIS — Z1329 Encounter for screening for other suspected endocrine disorder: Secondary | ICD-10-CM

## 2018-03-08 DIAGNOSIS — Z130 Encounter for screening for diseases of the blood and blood-forming organs and certain disorders involving the immune mechanism: Secondary | ICD-10-CM | POA: Diagnosis not present

## 2018-03-08 DIAGNOSIS — R7309 Other abnormal glucose: Secondary | ICD-10-CM

## 2018-03-08 DIAGNOSIS — Z79899 Other long term (current) drug therapy: Secondary | ICD-10-CM | POA: Diagnosis not present

## 2018-03-08 DIAGNOSIS — E559 Vitamin D deficiency, unspecified: Secondary | ICD-10-CM | POA: Diagnosis not present

## 2018-03-08 DIAGNOSIS — Z13220 Encounter for screening for lipoid disorders: Secondary | ICD-10-CM | POA: Diagnosis not present

## 2018-03-08 DIAGNOSIS — Z0000 Encounter for general adult medical examination without abnormal findings: Secondary | ICD-10-CM

## 2018-03-08 DIAGNOSIS — Z136 Encounter for screening for cardiovascular disorders: Secondary | ICD-10-CM | POA: Diagnosis not present

## 2018-03-08 DIAGNOSIS — Z131 Encounter for screening for diabetes mellitus: Secondary | ICD-10-CM | POA: Diagnosis not present

## 2018-03-08 DIAGNOSIS — Z1211 Encounter for screening for malignant neoplasm of colon: Secondary | ICD-10-CM

## 2018-03-08 DIAGNOSIS — Z111 Encounter for screening for respiratory tuberculosis: Secondary | ICD-10-CM

## 2018-03-08 DIAGNOSIS — Z0001 Encounter for general adult medical examination with abnormal findings: Secondary | ICD-10-CM

## 2018-03-08 DIAGNOSIS — R0989 Other specified symptoms and signs involving the circulatory and respiratory systems: Secondary | ICD-10-CM

## 2018-03-08 DIAGNOSIS — R5383 Other fatigue: Secondary | ICD-10-CM

## 2018-03-08 DIAGNOSIS — Z1212 Encounter for screening for malignant neoplasm of rectum: Secondary | ICD-10-CM

## 2018-03-08 DIAGNOSIS — Z1389 Encounter for screening for other disorder: Secondary | ICD-10-CM | POA: Diagnosis not present

## 2018-03-08 DIAGNOSIS — K219 Gastro-esophageal reflux disease without esophagitis: Secondary | ICD-10-CM

## 2018-03-08 NOTE — Progress Notes (Signed)
[facility] Unk Pinto, M.D.     Uvaldo Bristle. Silverio Lay, P.A.-C Liane Comber, [place] [address] [place] [address] Telephone [phone] Telefax [phone] Annual Screening/Preventative Visit & Comprehensive Evaluation &  Examination     This very nice 57 y.o. [facility] presents for a Screening /Preventative Visit & comprehensive evaluation and management of multiple medical co-morbidities.  Patient has been followed expectantly for elevated BP, elevated Cholesterol, abnormal Blood sugar and Vitamin D Deficiency.     Patient has hx/o elevated BP in 2005 and is folloewed expectantly. Patient's BP has been controlled at home and patient denies any cardiac symptoms as chest pain, palpitations, shortness of breath, dizziness or ankle swelling. Today's BP is borderline elevated at 132/90.      Patient's lipids are controlled with diet. Last lipids were at goal: Lab Results  Component Value Date   CHOL 193 03/21/2017   HDL 91 03/21/2017   LDLCALC 88 03/21/2017   TRIG 47 03/21/2017   CHOLHDL 2.1 03/21/2017      Patient has been monitred expectantly for abnormal glucoses and patient denies reactive hypoglycemic symptoms, visual blurring, diabetic polys or paresthesias. Last A1c was Normal & at goal: Lab Results  Component Value Date   HGBA1C 4.7 03/21/2017      Finally, patient has history of Vitamin D Deficiency and last Vitamin D was at goal: Lab Results  Component Value Date   VD25OH 103 (H) 03/21/2017   Current Outpatient Medications on File Prior to Visit  Medication Sig  . ALPRAZolam (XANAX) 1 MG tablet TAKE 1/2-1 TAB 2 OR 3 TIMES A DAY ONLY IF ACUTE ANXIETY.LIMIT TO 5 DAYS/WEEK.MUST LAST 6 WEEKS  . aspirin 81 MG tablet Take 81 mg by mouth daily.  Marland Kitchen buPROPion (WELLBUTRIN XL) 300 MG 24 hr tablet TAKE 1 TABLET BY MOUTH ONCE DAILY  . Cholecalciferol (VITAMIN D PO) Take 5,000 Units by mouth daily.    . fluticasone (FLONASE) 50 MCG/ACT nasal spray USE TWO SPRAY(S) IN EACH NOSTRIL ONCE DAILY  . ipratropium (ATROVENT) 0.06 % nasal spray Use 1 to 2 sprays each nostril 3 to 4 x / day  . mupirocin ointment (BACTROBAN) 2 % Place 1 application into the nose 2 (two) times daily.  Marland Kitchen OVER THE COUNTER MEDICATION Tumeric daily  . progesterone (PROMETRIUM) 100 MG capsule Take by mouth.  . promethazine (PHENERGAN) 25 MG tablet Take 1 tablet (25 mg total) by mouth every 6 (six) hours as needed for nausea or vomiting.  . tretinoin (RETIN-A) 0.1 % cream Apply at bedtime topically.  Marland Kitchen DIVIGEL 1 MG/GM GEL Apply daily   No current facility-administered medications on file prior to visit.    Allergies  Allergen Reactions  . Dtap-Ipv Vaccine Hives  . Flexeril [Cyclobenzaprine]    Past Medical History:  Diagnosis Date  . GERD (gastroesophageal reflux disease)   . Hypertension   . Palpitations   . Poor sleep hygiene    Health Maintenance  Topic Date Due  . PAP SMEAR  01/04/1983  . INFLUENZA VACCINE  11/17/2017  . MAMMOGRAM  09/20/2018  . Fecal DNA (Cologuard)  07/18/2020  . Hepatitis C Screening  Completed  . HIV Screening  Completed  . TETANUS/TDAP  Discontinued   Immunization History  Administered Date(s) Administered  . PPD Test 11/19/2013, 11/25/2014  . Pneumococcal Polysaccharide-23 10/29/2008   Cologard 07/18/2017 - due repeat 3 yrs in Apr 2019  Last MGM - 09/19/2017  Past Surgical  History:  Procedure Laterality Date  . SQUINT REPAIR Right 1974   Family History  Problem Relation Age of Onset  . Cancer Mother   . COPD Father    Social History   Tobacco Use  . Smoking status: Never Smoker  . Smokeless tobacco: Never Used  Substance Use Topics  . Alcohol use: Yes    Alcohol/week: 10.0 standard drinks    Types: 10 Standard drinks or equivalent per week    Comment: wine  . Drug use: No    ROS Constitutional: Denies fever, chills, weight loss/gain, headaches, insomnia,   night sweats, and change in appetite. Does c/o fatigue. Eyes: Denies redness, blurred vision, diplopia, discharge, itchy, watery eyes.  ENT: Denies discharge, congestion, post nasal drip, epistaxis, sore throat, earache, hearing loss, dental pain, Tinnitus, Vertigo, Sinus pain, snoring.  Cardio: Denies chest pain, palpitations, irregular heartbeat, syncope, dyspnea, diaphoresis, orthopnea, PND, claudication, edema Respiratory: denies cough, dyspnea, DOE, pleurisy, hoarseness, laryngitis, wheezing.  Gastrointestinal: Denies dysphagia, heartburn, reflux, water brash, pain, cramps, nausea, vomiting, bloating, diarrhea, constipation, hematemesis, melena, hematochezia, jaundice, hemorrhoids Genitourinary: Denies dysuria, frequency, urgency, nocturia, hesitancy, discharge, hematuria, flank pain Breast: Breast lumps, nipple discharge, bleeding.  Musculoskeletal: Denies arthralgia, myalgia, stiffness, Jt. Swelling, pain, limp, and strain/sprain. Denies falls. Skin: Denies puritis, rash, hives, warts, acne, eczema, changing in skin lesion Neuro: No weakness, tremor, incoordination, spasms, paresthesia, pain Psychiatric: Denies confusion, memory loss, sensory loss. Denies Depression. Endocrine: Denies change in weight, skin, hair change, nocturia, and paresthesia, diabetic polys, visual blurring, hyper / hypo glycemic episodes.  Heme/Lymph: No excessive bleeding, bruising, enlarged lymph nodes.  Physical Exam  BP 132/90   Pulse 76   Temp (!) 97.5 F (36.4 C)   Resp 16   Ht 5\' 1"  (1.549 m)   Wt 112 lb (50.8 kg)   BMI 21.16 kg/m   General Appearance: Well nourished, well groomed and in no apparent distress.  Eyes: PERRLA, EOMs, conjunctiva no swelling or erythema, normal fundi and vessels. Sinuses: No frontal/maxillary tenderness ENT/Mouth: EACs patent / TMs  nl. Nares clear without erythema, swelling, mucoid exudates. Oral hygiene is good. No erythema, swelling, or exudate. Tongue normal,  non-obstructing. Tonsils not swollen or erythematous. Hearing normal.  Neck: Supple, thyroid not palpable. No bruits, nodes or JVD. Respiratory: Respiratory effort normal.  BS equal and clear bilateral without rales, rhonci, wheezing or stridor. Cardio: Heart sounds are normal with regular rate and rhythm and no murmurs, rubs or gallops. Peripheral pulses are normal and equal bilaterally without edema. No aortic or femoral bruits. Chest: symmetric with normal excursions and percussion. Breasts: Symmetric, without lumps, nipple discharge, retractions, or fibrocystic changes.  Abdomen: Flat, soft with bowel sounds active. Nontender, no guarding, rebound, hernias, masses, or organomegaly.  Lymphatics: Non tender without lymphadenopathy.  Musculoskeletal: Full ROM all peripheral extremities, joint stability, 5/5 strength, and normal gait. Skin: Warm and dry without rashes, lesions, cyanosis, clubbing or  ecchymosis.  Neuro: Cranial nerves intact, reflexes equal bilaterally. Normal muscle tone, no cerebellar symptoms. Sensation intact.  Pysch: Alert and oriented X 3, normal affect, Insight and Judgment appropriate.   Assessment and Plan  1. Annual Preventative Screening Examination  2. Labile hypertension  - EKG 12-Lead - [place], RETROPERITNL ABD,  LTD - Urinalysis, Routine w reflex microscopic - Microalbumin / creatinine urine ratio - CBC with Differential/Platelet - COMPLETE METABOLIC PANEL WITH GFR - Magnesium - TSH  3. Screening cholesterol level  - EKG 12-Lead - [place], RETROPERITNL ABD,  LTD - Lipid panel -  TSH  4. Abnormal glucose  - EKG 12-Lead - [place], RETROPERITNL ABD,  LTD - Hemoglobin A1c - Insulin, random  5. Vitamin D deficiency  - VITAMIN D 25 Hydroxyl  6. Gastroesophageal reflux disease  - CBC with Differential/Platelet  7. Screening for colorectal cancer  - POC Hemoccult Bld/Stl  8. Screening for ischemic heart disease  - EKG 12-Lead  9. Screening for AAA  (aortic abdominal aneurysm)   10. Screening for tuberculosis  - TB Skin Test  11. Fatigue, unspecified type  - Iron,Total/Total Iron Binding Cap - Vitamin B12 - TSH  12. Medication management  - Urinalysis, Routine w reflex microscopic - Microalbumin / creatinine urine ratio - CBC with Differential/Platelet - COMPLETE METABOLIC PANEL WITH GFR - Magnesium - Lipid panel - TSH - Hemoglobin A1c - Insulin, random - VITAMIN D 25 Hydroxyl        Patient was counseled in prudent diet to achieve/maintain BMI less than 25 for weight control, BP monitoring, regular exercise and medications. Discussed med's effects and SE's. Screening labs and tests as requested with regular follow-up as recommended. Over 40 minutes of exam, counseling, chart review and high complex critical decision making was performed.

## 2018-03-08 NOTE — Patient Instructions (Signed)

## 2018-03-09 LAB — URINALYSIS, ROUTINE W REFLEX MICROSCOPIC
BACTERIA UA: NONE SEEN /HPF
BILIRUBIN URINE: NEGATIVE
GLUCOSE, UA: NEGATIVE
Hyaline Cast: NONE SEEN /LPF
KETONES UR: NEGATIVE
NITRITE: NEGATIVE
Protein, ur: NEGATIVE
SPECIFIC GRAVITY, URINE: 1.006 (ref 1.001–1.03)
WBC UA: NONE SEEN /HPF (ref 0–5)
pH: 8 (ref 5.0–8.0)

## 2018-03-09 LAB — COMPLETE METABOLIC PANEL WITH GFR
AG RATIO: 2.3 (calc) (ref 1.0–2.5)
ALT: 14 U/L (ref 6–29)
AST: 22 U/L (ref 10–35)
Albumin: 4.6 g/dL (ref 3.6–5.1)
Alkaline phosphatase (APISO): 46 U/L (ref 33–130)
BUN: 8 mg/dL (ref 7–25)
CO2: 29 mmol/L (ref 20–32)
CREATININE: 0.71 mg/dL (ref 0.50–1.05)
Calcium: 9.1 mg/dL (ref 8.6–10.4)
Chloride: 106 mmol/L (ref 98–110)
GFR, Est African American: 110 mL/min/{1.73_m2} (ref >=60)
GFR, Est Non African American: 95 mL/min/{1.73_m2} (ref >=60)
GLOBULIN: 2 g/dL (ref 1.9–3.7)
Glucose, Bld: 83 mg/dL (ref 65–99)
Potassium: 3.8 mmol/L (ref 3.5–5.3)
Sodium: 140 mmol/L (ref 135–146)
TOTAL PROTEIN: 6.6 g/dL (ref 6.1–8.1)
Total Bilirubin: 0.4 mg/dL (ref 0.2–1.2)

## 2018-03-09 LAB — VITAMIN B12: Vitamin B-12: 416 pg/mL (ref 200–1100)

## 2018-03-09 LAB — TSH: TSH: 1.78 mIU/L (ref 0.40–4.50)

## 2018-03-09 LAB — CBC WITH DIFFERENTIAL/PLATELET
BASOS PCT: 0.4 %
Basophils Absolute: 20 cells/uL (ref 0–200)
Eosinophils Absolute: 39 cells/uL (ref 15–500)
Eosinophils Relative: 0.8 %
HEMATOCRIT: 37.8 % (ref 35.0–45.0)
HEMOGLOBIN: 12.8 g/dL (ref 11.7–15.5)
LYMPHS ABS: 1397 {cells}/uL (ref 850–3900)
MCH: 30.7 pg (ref 27.0–33.0)
MCHC: 33.9 g/dL (ref 32.0–36.0)
MCV: 90.6 fL (ref 80.0–100.0)
MPV: 10.4 fL (ref 7.5–12.5)
Monocytes Relative: 10.6 %
NEUTROS ABS: 2925 {cells}/uL (ref 1500–7800)
Neutrophils Relative %: 59.7 %
Platelets: 194 10*3/uL (ref 140–400)
RBC: 4.17 10*6/uL (ref 3.80–5.10)
RDW: 12.8 % (ref 11.0–15.0)
Total Lymphocyte: 28.5 %
WBC: 4.9 10*3/uL (ref 3.8–10.8)
WBCMIX: 519 {cells}/uL (ref 200–950)

## 2018-03-09 LAB — LIPID PANEL
CHOLESTEROL: 167 mg/dL (ref <200)
HDL: 85 mg/dL (ref >50)
LDL Cholesterol (Calc): 67 mg/dL (calc)
Non-HDL Cholesterol (Calc): 82 mg/dL (calc) (ref <130)
TRIGLYCERIDES: 70 mg/dL (ref <150)
Total CHOL/HDL Ratio: 2 (calc) (ref <5.0)

## 2018-03-09 LAB — HEMOGLOBIN A1C
EAG (MMOL/L): 4.7 (calc)
HEMOGLOBIN A1C: 4.6 %{Hb} (ref <5.7)
MEAN PLASMA GLUCOSE: 85 (calc)

## 2018-03-09 LAB — IRON, TOTAL/TOTAL IRON BINDING CAP
%SAT: 30 % (calc) (ref 16–45)
Iron: 85 ug/dL (ref 45–160)
TIBC: 285 mcg/dL (calc) (ref 250–450)

## 2018-03-09 LAB — MICROALBUMIN / CREATININE URINE RATIO
Creatinine, Urine: 19 mg/dL — ABNORMAL LOW (ref 20–275)
MICROALB UR: 1.1 mg/dL
Microalb Creat Ratio: 58 mcg/mg creat — ABNORMAL HIGH (ref <30)

## 2018-03-09 LAB — MAGNESIUM: MAGNESIUM: 1.9 mg/dL (ref 1.5–2.5)

## 2018-03-09 LAB — VITAMIN D 25 HYDROXY (VIT D DEFICIENCY, FRACTURES): VIT D 25 HYDROXY: 64 ng/mL (ref 30–100)

## 2018-03-09 LAB — INSULIN, RANDOM: Insulin: 2.3 u[IU]/mL (ref 2.0–19.6)

## 2018-03-10 LAB — TB SKIN TEST
INDURATION: 0 mm
TB SKIN TEST: NEGATIVE

## 2018-03-31 ENCOUNTER — Encounter: Payer: Self-pay | Admitting: Internal Medicine

## 2018-05-06 ENCOUNTER — Other Ambulatory Visit: Payer: Self-pay | Admitting: Adult Health

## 2018-06-14 ENCOUNTER — Other Ambulatory Visit: Payer: Self-pay | Admitting: Internal Medicine

## 2018-06-20 ENCOUNTER — Other Ambulatory Visit: Payer: Self-pay | Admitting: Internal Medicine

## 2018-07-25 ENCOUNTER — Other Ambulatory Visit: Payer: Self-pay | Admitting: Internal Medicine

## 2018-07-28 ENCOUNTER — Other Ambulatory Visit: Payer: Self-pay | Admitting: Internal Medicine

## 2018-07-28 MED ORDER — BUPROPION HCL ER (XL) 300 MG PO TB24: ORAL_TABLET | ORAL | 1 refills | Status: DC

## 2018-08-10 DIAGNOSIS — F418 Other specified anxiety disorders: Secondary | ICD-10-CM | POA: Insufficient documentation

## 2018-09-28 ENCOUNTER — Other Ambulatory Visit: Payer: Self-pay | Admitting: Internal Medicine

## 2018-11-09 ENCOUNTER — Other Ambulatory Visit: Payer: Self-pay | Admitting: Internal Medicine

## 2019-02-04 ENCOUNTER — Other Ambulatory Visit: Payer: Self-pay | Admitting: Internal Medicine

## 2019-02-04 ENCOUNTER — Other Ambulatory Visit: Payer: Self-pay | Admitting: Adult Health

## 2019-02-13 ENCOUNTER — Other Ambulatory Visit: Payer: Self-pay | Admitting: Internal Medicine

## 2019-02-13 DIAGNOSIS — Z1231 Encounter for screening mammogram for malignant neoplasm of breast: Secondary | ICD-10-CM

## 2019-02-16 ENCOUNTER — Other Ambulatory Visit: Payer: Self-pay

## 2019-02-16 ENCOUNTER — Ambulatory Visit
Admission: RE | Admit: 2019-02-16 | Discharge: 2019-02-16 | Disposition: A | Discharge status: Home / Self Care | Payer: Managed Care, Other (non HMO) | Source: Ambulatory Visit | Attending: Internal Medicine | Admitting: Internal Medicine

## 2019-02-16 DIAGNOSIS — Z1231 Encounter for screening mammogram for malignant neoplasm of breast: Secondary | ICD-10-CM

## 2019-03-29 ENCOUNTER — Ambulatory Visit (INDEPENDENT_AMBULATORY_CARE_PROVIDER_SITE_OTHER): Payer: Managed Care, Other (non HMO) | Admitting: Internal Medicine

## 2019-03-29 ENCOUNTER — Encounter: Payer: Self-pay | Admitting: Internal Medicine

## 2019-03-29 ENCOUNTER — Other Ambulatory Visit: Payer: Self-pay

## 2019-03-29 VITALS — BP 114/74 | HR 72 | Temp 97.2°F | Resp 16 | Ht 61.0 in | Wt 116.2 lb

## 2019-03-29 DIAGNOSIS — Z0000 Encounter for general adult medical examination without abnormal findings: Secondary | ICD-10-CM | POA: Diagnosis not present

## 2019-03-29 DIAGNOSIS — Z130 Encounter for screening for diseases of the blood and blood-forming organs and certain disorders involving the immune mechanism: Secondary | ICD-10-CM

## 2019-03-29 DIAGNOSIS — R5383 Other fatigue: Secondary | ICD-10-CM

## 2019-03-29 DIAGNOSIS — E559 Vitamin D deficiency, unspecified: Secondary | ICD-10-CM

## 2019-03-29 DIAGNOSIS — Z111 Encounter for screening for respiratory tuberculosis: Secondary | ICD-10-CM | POA: Diagnosis not present

## 2019-03-29 DIAGNOSIS — I10 Essential (primary) hypertension: Secondary | ICD-10-CM | POA: Diagnosis not present

## 2019-03-29 DIAGNOSIS — R7309 Other abnormal glucose: Secondary | ICD-10-CM

## 2019-03-29 DIAGNOSIS — Z13220 Encounter for screening for lipoid disorders: Secondary | ICD-10-CM

## 2019-03-29 DIAGNOSIS — Z0001 Encounter for general adult medical examination with abnormal findings: Secondary | ICD-10-CM

## 2019-03-29 DIAGNOSIS — Z1212 Encounter for screening for malignant neoplasm of rectum: Secondary | ICD-10-CM

## 2019-03-29 DIAGNOSIS — Z79899 Other long term (current) drug therapy: Secondary | ICD-10-CM | POA: Diagnosis not present

## 2019-03-29 DIAGNOSIS — Z131 Encounter for screening for diabetes mellitus: Secondary | ICD-10-CM | POA: Diagnosis not present

## 2019-03-29 DIAGNOSIS — Z1389 Encounter for screening for other disorder: Secondary | ICD-10-CM | POA: Diagnosis not present

## 2019-03-29 DIAGNOSIS — R0989 Other specified symptoms and signs involving the circulatory and respiratory systems: Secondary | ICD-10-CM

## 2019-03-29 DIAGNOSIS — Z136 Encounter for screening for cardiovascular disorders: Secondary | ICD-10-CM

## 2019-03-29 DIAGNOSIS — Z1211 Encounter for screening for malignant neoplasm of colon: Secondary | ICD-10-CM

## 2019-03-29 NOTE — Progress Notes (Signed)
Annual Screening/Preventative Visit & Comprehensive Evaluation &  Examination     This very nice 58 y.o. WWF presents for a Screening /Preventative Visit & comprehensive evaluation and management of multiple medical co-morbidities.  Patient has been followed expectantly for HTN, HLD, Prediabetes  and Vitamin D Deficiency.      Patient has hx/o an elevated BP in the remote past and has been monitored expectantly for HTN.  Last year patient had a borderline elevated BP at 132/90. Patient's BP has been controlled and patient denies any cardiac symptoms as chest pain, palpitations, shortness of breath, dizziness or ankle swelling. Today's BP is at goal - 114/74.      Patient's lipids are controlled with diet. Last lipids were at goal:  Lab Results  Component Value Date   CHOL 167 03/08/2018   HDL 85 03/08/2018   LDLCALC 67 03/08/2018   TRIG 70 03/08/2018   CHOLHDL 2.0 03/08/2018      Patient is monitored expectantly for abnormal glucose & glucose intolerance  and patient denies reactive hypoglycemic symptoms, visual blurring, diabetic polys or paresthesias. Last A1c was Normal & at goal:  Lab Results  Component Value Date   HGBA1C 4.6 03/08/2018      Finally, patient has history of Vitamin D Deficiency and last Vitamin D was at goal:  Lab Results  Component Value Date   VD25OH 64 03/08/2018    Current Outpatient Medications on File Prior to Visit  Medication Sig  . ALPRAZolam (XANAX) 1 MG tablet Take 1/2 to 1 tablet 2 to 3 x / day  ONLY if needed for Anxiety / Panic Attacks & to limit to 5 days /week to avoid addiction  . Ascorbic Acid (VITAMIN C) 1000 MG tablet Take 1,000 mg by mouth daily.  Marland Kitchen aspirin 81 MG tablet Take 81 mg by mouth daily.  . Cholecalciferol (VITAMIN D PO) Take 5,000 Units by mouth daily.   Marland Kitchen ipratropium (ATROVENT) 0.06 % nasal spray Use 1 to 2 sprays each Nostril 3 to 4 x /day  . mupirocin ointment (BACTROBAN) 2 % Place 1 application into the nose 2 (two) times  daily.  Marland Kitchen OVER THE COUNTER MEDICATION Tumeric daily  . progesterone (PROMETRIUM) 100 MG capsule Take by mouth.  . promethazine (PHENERGAN) 25 MG tablet Take 1 tablet (25 mg total) by mouth every 6 (six) hours as needed for nausea or vomiting.  . tretinoin (RETIN-A) 0.1 % cream Apply at bedtime topically.  . WELLBUTRIN XL 300 MG 24 hr tablet Take 1 tablet Daily for Mood, Focus & Concentration  . zinc gluconate 50 MG tablet Take 50 mg by mouth daily.  Marland Kitchen DIVIGEL 1 MG/GM GEL Apply daily   No current facility-administered medications on file prior to visit.   Allergies  Allergen Reactions  . Dtap-Ipv Vaccine Hives  . Flexeril [Cyclobenzaprine]    Past Medical History:  Diagnosis Date  . GERD (gastroesophageal reflux disease)   . Hypertension   . Palpitations   . Poor sleep hygiene    Health Maintenance  Topic Date Due  . PAP SMEAR-Modifier  01/04/1983  . INFLUENZA VACCINE  11/18/2018  . MAMMOGRAM  02/16/2020  . Fecal DNA (Cologuard)  07/18/2020  . Hepatitis C Screening  Completed  . HIV Screening  Completed  . TETANUS/TDAP  Discontinued   Immunization History  Administered Date(s) Administered  . PPD Test 11/19/2013, 11/25/2014, 03/08/2018, 03/29/2019  . Pneumococcal Polysaccharide-23 10/29/2008   Cologard - Negative 07/18/2017 - due repeat 3 yrs in Apr  2022  Last MGM - 02/19/2019  Past Surgical History:  Procedure Laterality Date  . SQUINT REPAIR Right 1974   Family History  Problem Relation Age of Onset  . Cancer Mother   . COPD Father    Social History   Tobacco Use  . Smoking status: Never Smoker  . Smokeless tobacco: Never Used  Substance Use Topics  . Alcohol use: Yes    Alcohol/week: 10.0 standard drinks    Types: 10 Standard drinks or equivalent per week    Comment: wine  . Drug use: No    ROS Constitutional: Denies fever, chills, weight loss/gain, headaches, insomnia,  night sweats, and change in appetite. Does c/o fatigue. Eyes: Denies redness,  blurred vision, diplopia, discharge, itchy, watery eyes.  ENT: Denies discharge, congestion, post nasal drip, epistaxis, sore throat, earache, hearing loss, dental pain, Tinnitus, Vertigo, Sinus pain, snoring.  Cardio: Denies chest pain, palpitations, irregular heartbeat, syncope, dyspnea, diaphoresis, orthopnea, PND, claudication, edema Respiratory: denies cough, dyspnea, DOE, pleurisy, hoarseness, laryngitis, wheezing.  Gastrointestinal: Denies dysphagia, heartburn, reflux, water brash, pain, cramps, nausea, vomiting, bloating, diarrhea, constipation, hematemesis, melena, hematochezia, jaundice, hemorrhoids Genitourinary: Denies dysuria, frequency, urgency, nocturia, hesitancy, discharge, hematuria, flank pain Breast: Breast lumps, nipple discharge, bleeding.  Musculoskeletal: Denies arthralgia, myalgia, stiffness, Jt. Swelling, pain, limp, and strain/sprain. Denies falls. Skin: Denies puritis, rash, hives, warts, acne, eczema, changing in skin lesion Neuro: No weakness, tremor, incoordination, spasms, paresthesia, pain Psychiatric: Denies confusion, memory loss, sensory loss. Denies Depression. Endocrine: Denies change in weight, skin, hair change, nocturia, and paresthesia, diabetic polys, visual blurring, hyper / hypo glycemic episodes.  Heme/Lymph: No excessive bleeding, bruising, enlarged lymph nodes.  Physical Exam  BP 114/74   Pulse 72   Temp (!) 97.2 F (36.2 C)   Resp 16   Ht 5\' 1"  (1.549 m)   Wt 116 lb 3.2 oz (52.7 kg)   BMI 21.96 kg/m   General Appearance: Well nourished, well groomed and in no apparent distress.  Eyes: PERRLA, EOMs, conjunctiva no swelling or erythema, normal fundi and vessels. Sinuses: No frontal/maxillary tenderness ENT/Mouth: EACs patent / TMs  nl. Nares clear without erythema, swelling, mucoid exudates. Oral hygiene is good. No erythema, swelling, or exudate. Tongue normal, non-obstructing. Tonsils not swollen or erythematous. Hearing normal.  Neck:  Supple, thyroid not palpable. No bruits, nodes or JVD. Respiratory: Respiratory effort normal.  BS equal and clear bilateral without rales, rhonci, wheezing or stridor. Cardio: Heart sounds are normal with regular rate and rhythm and no murmurs, rubs or gallops. Peripheral pulses are normal and equal bilaterally without edema. No aortic or femoral bruits. Chest: symmetric with normal excursions and percussion. Breasts: Symmetric, without lumps, nipple discharge, retractions, or fibrocystic changes.  Abdomen: Flat, soft with bowel sounds active. Nontender, no guarding, rebound, hernias, masses, or organomegaly.  Lymphatics: Non tender without lymphadenopathy.  Genitourinary:  Musculoskeletal: Full ROM all peripheral extremities, joint stability, 5/5 strength, and normal gait. Skin: Warm and dry without rashes, lesions, cyanosis, clubbing or  ecchymosis.  Neuro: Cranial nerves intact, reflexes equal bilaterally. Normal muscle tone, no cerebellar symptoms. Sensation intact.  Pysch: Alert and oriented X 3, normal affect, Insight and Judgment appropriate.   Assessment and Plan  1. Annual Preventative Screening Examination  2. Labile hypertension  - EKG 12-Lead - [place], retroperitnl abd,  ltd - Urinalysis, Routine w reflex microscopic - Microalbumin / Creatinine Urine Ratio - CBC with Diff - COMPLETE METABOLIC PANEL WITH GFR - Magnesium - TSH  3. Screening cholesterol level  -  EKG 12-Lead - [place], retroperitnl abd,  ltd - Lipid Profile - TSH  4. Abnormal glucose  - EKG 12-Lead - [place], retroperitnl abd,  ltd - Hemoglobin A1c ([facility]) - Insulin, random  5. Vitamin D deficiency  - Vitamin D (25 hydroxy)  6. Screening for colorectal cancer  - POC Hemoccult Bld/Stl   7. Screening for ischemic heart disease  - EKG 12-Lead  8. Screening for AAA (aortic abdominal aneurysm)  - [place], retroperitnl abd,  ltd  9. Screening for tuberculosis  - TB Skin Test  10. Fatigue, unspecified  type  - Iron,Total/Total Iron Binding Cap - Vitamin B12 - CBC with Diff - TSH  11. Medication management  - Urinalysis, Routine w reflex microscopic - Microalbumin / Creatinine Urine Ratio - CBC with Diff - COMPLETE METABOLIC PANEL WITH GFR - Magnesium - Lipid Profile - TSH - Hemoglobin A1c ([facility]) - Insulin, random - Vitamin D (25 hydroxy)        Patient was counseled in prudent diet to achieve/maintain BMI less than 25 for weight control, BP monitoring, regular exercise and medications. Discussed med's effects and SE's. Screening labs and tests as requested with regular follow-up as recommended. Over 40 minutes of exam, counseling, chart review and high complex critical decision making was performed.   Kirtland Bouchard, MD

## 2019-03-29 NOTE — Patient Instructions (Signed)

## 2019-03-30 LAB — IRON, TOTAL/TOTAL IRON BINDING CAP
%SAT: 34 % (calc) (ref 16–45)
Iron: 99 ug/dL (ref 45–160)
TIBC: 290 mcg/dL (calc) (ref 250–450)

## 2019-03-30 LAB — COMPLETE METABOLIC PANEL WITH GFR
AG Ratio: 2 (calc) (ref 1.0–2.5)
ALT: 14 U/L (ref 6–29)
AST: 21 U/L (ref 10–35)
Albumin: 4.7 g/dL (ref 3.6–5.1)
Alkaline phosphatase (APISO): 55 U/L (ref 37–153)
BUN: 11 mg/dL (ref 7–25)
CO2: 25 mmol/L (ref 20–32)
Calcium: 9.9 mg/dL (ref 8.6–10.4)
Chloride: 103 mmol/L (ref 98–110)
Creat: 0.79 mg/dL (ref 0.50–1.05)
GFR, Est African American: 96 mL/min/{1.73_m2} (ref >=60)
GFR, Est Non African American: 83 mL/min/{1.73_m2} (ref >=60)
Globulin: 2.4 g/dL (calc) (ref 1.9–3.7)
Glucose, Bld: 75 mg/dL (ref 65–99)
Potassium: 4 mmol/L (ref 3.5–5.3)
Sodium: 140 mmol/L (ref 135–146)
Total Bilirubin: 0.5 mg/dL (ref 0.2–1.2)
Total Protein: 7.1 g/dL (ref 6.1–8.1)

## 2019-03-30 LAB — URINALYSIS, ROUTINE W REFLEX MICROSCOPIC
Bacteria, UA: NONE SEEN /HPF
Bilirubin Urine: NEGATIVE
Glucose, UA: NEGATIVE
Hgb urine dipstick: NEGATIVE
Hyaline Cast: NONE SEEN /LPF
Nitrite: NEGATIVE
Protein, ur: NEGATIVE
RBC / HPF: NONE SEEN /HPF (ref 0–2)
Specific Gravity, Urine: 1.005 (ref 1.001–1.03)
Squamous Epithelial / HPF: NONE SEEN /HPF (ref <=5)
pH: 6.5 (ref 5.0–8.0)

## 2019-03-30 LAB — CBC WITH DIFFERENTIAL/PLATELET
Absolute Monocytes: 603 cells/uL (ref 200–950)
Basophils Absolute: 41 cells/uL (ref 0–200)
Basophils Relative: 0.7 %
Eosinophils Absolute: 52 cells/uL (ref 15–500)
Eosinophils Relative: 0.9 %
HCT: 42.1 % (ref 35.0–45.0)
Hemoglobin: 14 g/dL (ref 11.7–15.5)
Lymphs Abs: 1462 cells/uL (ref 850–3900)
MCH: 30.8 pg (ref 27.0–33.0)
MCHC: 33.3 g/dL (ref 32.0–36.0)
MCV: 92.7 fL (ref 80.0–100.0)
MPV: 10.2 fL (ref 7.5–12.5)
Monocytes Relative: 10.4 %
Neutro Abs: 3642 cells/uL (ref 1500–7800)
Neutrophils Relative %: 62.8 %
Platelets: 223 10*3/uL (ref 140–400)
RBC: 4.54 10*6/uL (ref 3.80–5.10)
RDW: 12.5 % (ref 11.0–15.0)
Total Lymphocyte: 25.2 %
WBC: 5.8 10*3/uL (ref 3.8–10.8)

## 2019-03-30 LAB — MICROALBUMIN / CREATININE URINE RATIO
Creatinine, Urine: 18 mg/dL — ABNORMAL LOW (ref 20–275)
Microalb, Ur: <0.2 mg/dL

## 2019-03-30 LAB — LIPID PANEL
Cholesterol: 207 mg/dL — ABNORMAL HIGH (ref <200)
HDL: 93 mg/dL (ref >=50)
LDL Cholesterol (Calc): 98 mg/dL (calc)
Non-HDL Cholesterol (Calc): 114 mg/dL (calc) (ref <130)
Total CHOL/HDL Ratio: 2.2 (calc) (ref <5.0)
Triglycerides: 74 mg/dL (ref <150)

## 2019-03-30 LAB — HEMOGLOBIN A1C
Hgb A1c MFr Bld: 4.8 % of total Hgb (ref <5.7)
Mean Plasma Glucose: 91 (calc)
eAG (mmol/L): 5 (calc)

## 2019-03-30 LAB — TSH: TSH: 2.03 mIU/L (ref 0.40–4.50)

## 2019-03-30 LAB — MAGNESIUM: Magnesium: 1.8 mg/dL (ref 1.5–2.5)

## 2019-03-30 LAB — VITAMIN B12: Vitamin B-12: 837 pg/mL (ref 200–1100)

## 2019-03-30 LAB — VITAMIN D 25 HYDROXY (VIT D DEFICIENCY, FRACTURES): Vit D, 25-Hydroxy: 92 ng/mL (ref 30–100)

## 2019-03-30 LAB — INSULIN, RANDOM: Insulin: 1.7 u[IU]/mL

## 2019-03-31 ENCOUNTER — Encounter: Payer: Self-pay | Admitting: Internal Medicine

## 2019-04-02 LAB — TB SKIN TEST
Induration: 0 mm
TB Skin Test: NEGATIVE

## 2019-05-04 ENCOUNTER — Other Ambulatory Visit: Payer: Self-pay

## 2019-05-04 DIAGNOSIS — Z1212 Encounter for screening for malignant neoplasm of rectum: Secondary | ICD-10-CM

## 2019-05-04 DIAGNOSIS — Z1211 Encounter for screening for malignant neoplasm of colon: Secondary | ICD-10-CM

## 2019-05-04 LAB — POC HEMOCCULT BLD/STL (HOME/3-CARD/SCREEN)
Card #2 Fecal Occult Blod, POC: NEGATIVE
Card #3 Fecal Occult Blood, POC: NEGATIVE
Fecal Occult Blood, POC: NEGATIVE

## 2019-05-07 DIAGNOSIS — Z1212 Encounter for screening for malignant neoplasm of rectum: Secondary | ICD-10-CM | POA: Diagnosis not present

## 2019-05-07 DIAGNOSIS — Z1211 Encounter for screening for malignant neoplasm of colon: Secondary | ICD-10-CM | POA: Diagnosis not present

## 2019-05-29 ENCOUNTER — Other Ambulatory Visit: Payer: Self-pay | Admitting: Internal Medicine

## 2019-05-29 MED ORDER — ROPINIROLE HCL 1 MG PO TABS: ORAL_TABLET | ORAL | 2 refills | Status: DC

## 2019-06-05 ENCOUNTER — Other Ambulatory Visit: Payer: Self-pay | Admitting: Adult Health Nurse Practitioner

## 2019-06-05 ENCOUNTER — Other Ambulatory Visit: Payer: Self-pay | Admitting: Internal Medicine

## 2019-07-18 ENCOUNTER — Ambulatory Visit: Payer: Managed Care, Other (non HMO) | Attending: Internal Medicine

## 2019-07-18 DIAGNOSIS — Z20822 Contact with and (suspected) exposure to covid-19: Secondary | ICD-10-CM

## 2019-07-19 ENCOUNTER — Other Ambulatory Visit: Payer: Self-pay | Admitting: Adult Health Nurse Practitioner

## 2019-07-19 LAB — NOVEL CORONAVIRUS, NAA: SARS-CoV-2, NAA: NOT DETECTED

## 2019-07-23 ENCOUNTER — Encounter: Payer: Self-pay | Admitting: *Deleted

## 2019-07-27 ENCOUNTER — Telehealth: Payer: Self-pay | Admitting: *Deleted

## 2019-07-27 NOTE — Telephone Encounter (Signed)
After multiple attempts to reach the patient, a message was left informing her that brand Wellbutrin is not on her medication plan, unless you are allergic to the generic. She will not be able to use the co-pay coupon, but the representative states the Bupropion 300 mg extended release will be $20 for # 90. The patient was asked to return my call.

## 2019-07-30 ENCOUNTER — Other Ambulatory Visit: Payer: Self-pay | Admitting: *Deleted

## 2019-07-30 MED ORDER — BUPROPION HCL ER (XL) 300 MG PO TB24: 300.0000 mg | ORAL_TABLET | Freq: Every day | ORAL | 3 refills | Status: DC

## 2019-08-03 ENCOUNTER — Ambulatory Visit: Payer: Managed Care, Other (non HMO) | Attending: Internal Medicine

## 2019-08-03 DIAGNOSIS — Z23 Encounter for immunization: Secondary | ICD-10-CM

## 2019-08-03 NOTE — Progress Notes (Signed)
   Covid-19 Vaccination Clinic  Name:  Leah Patrick    MRN: [id number] DOB: 05/08/1960  08/03/2019  Ms. Leah Patrick was observed post Covid-19 immunization for 15 minutes without incident. She was provided with Vaccine Information Sheet and instruction to access the V-Safe system.   Ms. Leah Patrick was instructed to call 911 with any severe reactions post vaccine: Marland Kitchen Difficulty breathing  . Swelling of face and throat  . A fast heartbeat  . A bad rash all over body  . Dizziness and weakness   Immunizations Administered    Name Date Dose VIS Date Route   Pfizer COVID-19 Vaccine 08/03/2019  3:02 PM 0.3 mL 03/30/2019 Intramuscular   Manufacturer: [place]   Lot: [id number]   [place]: [id number]

## 2019-08-15 ENCOUNTER — Other Ambulatory Visit: Payer: Self-pay | Admitting: Internal Medicine

## 2019-08-28 ENCOUNTER — Ambulatory Visit: Payer: Managed Care, Other (non HMO)

## 2019-09-03 ENCOUNTER — Ambulatory Visit: Payer: Managed Care, Other (non HMO) | Attending: Internal Medicine

## 2019-09-03 DIAGNOSIS — Z23 Encounter for immunization: Secondary | ICD-10-CM

## 2019-09-03 NOTE — Progress Notes (Signed)
   Covid-19 Vaccination Clinic  Name:  Leah Patrick    MRN: [id number] DOB: November 23, 1960  09/03/2019  [name] was observed post Covid-19 immunization for 15 minutes without incident. She was provided with Vaccine Information Sheet and instruction to access the V-Safe system.   [name] was instructed to call 911 with any severe reactions post vaccine: Marland Kitchen Difficulty breathing  . Swelling of face and throat  . A fast heartbeat  . A bad rash all over body  . Dizziness and weakness   Immunizations Administered    Name Date Dose VIS Date Route   Pfizer COVID-19 Vaccine 09/03/2019  4:48 PM 0.3 mL 06/13/2018 Intramuscular   Manufacturer: [place]   Lot: [id number]   [place]: [id number]

## 2019-09-18 ENCOUNTER — Other Ambulatory Visit: Payer: Self-pay | Admitting: Adult Health

## 2019-09-26 NOTE — Progress Notes (Signed)
FOLLOW UP  Assessment and Plan:   Hypertension Well controlled at this time off of medications Monitor blood pressure at home; patient to call if consistently greater than 130/80 Continue DASH diet.   Reminder to go to the ER if any CP, SOB, nausea, dizziness, severe HA, changes vision/speech, left arm numbness and tingling and jaw pain. -     COMPLETE METABOLIC PANEL WITH GFR  Vitamin D deficiency Continue supplement  Mixed anxiety and depressive disorder Continue wellbutrin; discussed decreasing benzo to avoid daily use for sleep She is very receptive and plans to start a slow taper; alternatives discussed information provided on AVS; she declines other prescription alterantive at this time, will try melatonin/benadryl if needed Stress management techniques discussed, increase water, good sleep hygiene discussed, increase exercise, and increase veggies.   Medication management -     COMPLETE METABOLIC PANEL WITH GFR  BMI 22.0-22.9, adult Monitor  Hyperlipidemia, unspecified hyperlipidemia type Patient preference to recheck today; diet is much worse than usual Typically well controlled by lifestyle; reduce fatty foods, increase exercise  -     COMPLETE METABOLIC PANEL WITH GFR -     Lipid panel -     TSH  Continue diet and meds as discussed. Further disposition pending results of labs. Discussed med's effects and SE's.   Over 30 minutes of exam, counseling, chart review, and critical decision making was performed.   Future Appointments  Date Time Provider [place]  04/28/2020  2:00 PM Unk Pinto, MD [facility] None    ----------------------------------------------------------------------------------------------------------------------  HPI 59 y.o. female  presents for 6 month follow up on hypertension, mood/xanax, weight and vitamin D deficiency.   She has mild depression/anxiety and is prescribed wellbutrin 300 mg daily, PRN xanax, taking 1 tab nightly,  hasn't needed during the day.   BMI is Body mass index is 22.67 kg/m., she has not been working on diet and exercise, usually would do weights, recently only doing some light walking, has been busy at work and having more company, not eating as well.  Wt Readings from Last 3 Encounters:  09/27/19 120 lb (54.4 kg)  03/29/19 116 lb 3.2 oz (52.7 kg)  03/08/18 112 lb (50.8 kg)   Her blood pressure has been controlled at home (120/70s), today their BP is BP: 112/74  She does workout. She denies chest pain, shortness of breath, dizziness. She has reported rare palpitations, flutter in her chest, very brief a few sec and resolves spontaneously, typically when feeling stressed at work, denies CP, dizziness, dyspnea.    She is not on cholesterol medication. Her LDL cholesterol is at goal. The cholesterol last visit was:   Lab Results  Component Value Date   CHOL 207 (H) 03/29/2019   HDL 93 03/29/2019   LDLCALC 98 03/29/2019   TRIG 74 03/29/2019   CHOLHDL 2.2 03/29/2019    She has been working on diet and exercise for glucose management. Last A1C in the office was:  Lab Results  Component Value Date   HGBA1C 4.8 03/29/2019   Last GFR:  Lab Results  Component Value Date   GFRNONAA 83 03/29/2019   Patient is on Vitamin D supplement, recently switched to formulation with vitamin K, taking 5000 IU daily  Lab Results  Component Value Date   VD25OH 92 03/29/2019       Current Medications:  Current Outpatient Medications on File Prior to Visit  Medication Sig  . ALPRAZolam (XANAX) 1 MG tablet 1/2-1 TAB 2-3 TIMES A DAY  ONLY IF NEED FOR ANXIETY/PANIC ATTACK/LIMIT 5 DAYS/WEEK TO AVOID ADDICTION  . Ascorbic Acid (VITAMIN C) 1000 MG tablet Take 1,000 mg by mouth daily.  Marland Kitchen aspirin 81 MG tablet Take 81 mg by mouth daily.  Marland Kitchen buPROPion (WELLBUTRIN XL) 300 MG 24 hr tablet Take 1 tablet (300 mg total) by mouth daily.  Marland Kitchen DIVIGEL 1 MG/GM GEL Apply daily  . ipratropium (ATROVENT) 0.06 % nasal spray  Use 1 to 2 sprays each Nostril 3 to 4 x /day  . Multiple Vitamins-Minerals (MULTIVITAMIN WOMEN PO) Take by mouth daily.  . mupirocin ointment (BACTROBAN) 2 % Place 1 application into the nose 2 (two) times daily.  Marland Kitchen OVER THE COUNTER MEDICATION Tumeric daily  . OVER THE COUNTER MEDICATION daily. Omega Red  . progesterone (PROMETRIUM) 100 MG capsule Take by mouth.  . promethazine (PHENERGAN) 25 MG tablet Take 1 tablet (25 mg total) by mouth every 6 (six) hours as needed for nausea or vomiting.  Marland Kitchen RESVERATROL PO Take 500 mg by mouth.  Marland Kitchen rOPINIRole (REQUIP) 1 MG tablet TAKE 1/2 TO 1 TABLET 2 TO 3 X /DAY FOR RESTLESS LEGS  . tretinoin (RETIN-A) 0.1 % cream Apply at bedtime topically.  . Vitamin D-Vitamin K (VITAMIN K2-VITAMIN D3 PO) Take by mouth daily.  Marland Kitchen zinc gluconate 50 MG tablet Take 50 mg by mouth daily.   No current facility-administered medications on file prior to visit.     Allergies:  Allergies  Allergen Reactions  . Dtap-Ipv Vaccine Hives  . Flexeril [Cyclobenzaprine]      Medical History:  Past Medical History:  Diagnosis Date  . GERD (gastroesophageal reflux disease)   . Hypertension   . Palpitations   . Poor sleep hygiene    Family history- Reviewed and unchanged Social history- Reviewed and unchanged   Review of Systems:  Review of Systems  Constitutional: Negative for malaise/fatigue and weight loss.  HENT: Negative for hearing loss and tinnitus.   Eyes: Negative for blurred vision and double vision.  Respiratory: Negative for cough, shortness of breath and wheezing.   Cardiovascular: Negative for chest pain, palpitations, orthopnea, claudication and leg swelling.  Gastrointestinal: Negative for abdominal pain, blood in stool, constipation, diarrhea, heartburn, melena, nausea and vomiting.  Genitourinary: Negative.   Musculoskeletal: Negative for joint pain and myalgias.  Skin: Negative for rash.  Neurological: Negative for dizziness, tingling, sensory  change, weakness and headaches.  Endo/Heme/Allergies: Negative for polydipsia.  Psychiatric/Behavioral: Negative for depression and substance abuse. The patient is not nervous/anxious and does not have insomnia.   All other systems reviewed and are negative.   Physical Exam: BP 112/74   Pulse 66   Temp (!) 97.3 F (36.3 C)   Ht 5\' 1"  (1.549 m)   Wt 120 lb (54.4 kg)   SpO2 99%   BMI 22.67 kg/m  Wt Readings from Last 3 Encounters:  09/27/19 120 lb (54.4 kg)  03/29/19 116 lb 3.2 oz (52.7 kg)  03/08/18 112 lb (50.8 kg)   General Appearance: Well nourished, in no apparent distress. Eyes: PERRLA, EOMs, conjunctiva no swelling or erythema Sinuses: No Frontal/maxillary tenderness ENT/Mouth: Ext aud canals clear, TMs without erythema, bulging. No erythema, swelling, or exudate on post pharynx.  Tonsils not swollen or erythematous. Hearing normal.  Neck: Supple, thyroid normal.  Respiratory: Respiratory effort normal, BS equal bilaterally without rales, rhonchi, wheezing or stridor.  Cardio: RRR with no MRGs. Brisk peripheral pulses without edema.  Abdomen: Soft, + BS.  Non tender, no guarding, rebound,  hernias, masses. Lymphatics: Non tender without lymphadenopathy.  Musculoskeletal: Full ROM, 5/5 strength, Normal gait Skin: Warm, dry without rashes, lesions, ecchymosis.  Neuro: Cranial nerves intact. No cerebellar symptoms.  Psych: Awake and oriented X 3, normal affect, Insight and Judgment appropriate.    Izora Ribas, NP 12:02 PM [facility]

## 2019-09-27 ENCOUNTER — Other Ambulatory Visit: Payer: Self-pay

## 2019-09-27 ENCOUNTER — Encounter: Payer: Self-pay | Admitting: Adult Health

## 2019-09-27 ENCOUNTER — Ambulatory Visit (INDEPENDENT_AMBULATORY_CARE_PROVIDER_SITE_OTHER): Payer: Managed Care, Other (non HMO) | Admitting: Adult Health

## 2019-09-27 VITALS — BP 112/74 | HR 66 | Temp 97.3°F | Ht 61.0 in | Wt 120.0 lb

## 2019-09-27 DIAGNOSIS — F418 Other specified anxiety disorders: Secondary | ICD-10-CM

## 2019-09-27 DIAGNOSIS — E559 Vitamin D deficiency, unspecified: Secondary | ICD-10-CM

## 2019-09-27 DIAGNOSIS — E785 Hyperlipidemia, unspecified: Secondary | ICD-10-CM | POA: Diagnosis not present

## 2019-09-27 DIAGNOSIS — I10 Essential (primary) hypertension: Secondary | ICD-10-CM | POA: Diagnosis not present

## 2019-09-27 DIAGNOSIS — R002 Palpitations: Secondary | ICD-10-CM

## 2019-09-27 DIAGNOSIS — Z79899 Other long term (current) drug therapy: Secondary | ICD-10-CM

## 2019-09-27 DIAGNOSIS — Z6822 Body mass index (BMI) 22.0-22.9, adult: Secondary | ICD-10-CM

## 2019-09-27 LAB — COMPLETE METABOLIC PANEL WITH GFR
AG Ratio: 2.2 (calc) (ref 1.0–2.5)
ALT: 14 U/L (ref 6–29)
AST: 23 U/L (ref 10–35)
Albumin: 4.7 g/dL (ref 3.6–5.1)
Alkaline phosphatase (APISO): 50 U/L (ref 37–153)
BUN: 12 mg/dL (ref 7–25)
CO2: 25 mmol/L (ref 20–32)
Calcium: 9.2 mg/dL (ref 8.6–10.4)
Chloride: 104 mmol/L (ref 98–110)
Creat: 0.82 mg/dL (ref 0.50–1.05)
GFR, Est African American: 92 mL/min/{1.73_m2} (ref >=60)
GFR, Est Non African American: 79 mL/min/{1.73_m2} (ref >=60)
Globulin: 2.1 g/dL (calc) (ref 1.9–3.7)
Glucose, Bld: 74 mg/dL (ref 65–99)
Potassium: 4.1 mmol/L (ref 3.5–5.3)
Sodium: 139 mmol/L (ref 135–146)
Total Bilirubin: 0.5 mg/dL (ref 0.2–1.2)
Total Protein: 6.8 g/dL (ref 6.1–8.1)

## 2019-09-27 LAB — TSH: TSH: 1.99 mIU/L (ref 0.40–4.50)

## 2019-09-27 LAB — LIPID PANEL
Cholesterol: 173 mg/dL (ref <200)
HDL: 94 mg/dL (ref >=50)
LDL Cholesterol (Calc): 66 mg/dL (calc)
Non-HDL Cholesterol (Calc): 79 mg/dL (calc) (ref <130)
Total CHOL/HDL Ratio: 1.8 (calc) (ref <5.0)
Triglycerides: 52 mg/dL (ref <150)

## 2019-09-27 NOTE — Patient Instructions (Addendum)
LDL "bad cholesterol" goal is <100     NEW GUIDELINES FOR BENOZOS  New guidelines suggest the benzodiazepines are best short term, with prolonged use they lead to physical and psychological dependence. In addition, evidence suggest that for insomnia the effectiveness wanes in 4 weeks and the risks out weight their benefits. Use of these agents have been associated with dementia, falls, motor vehicle accidents and physical addiction. Decreasing these medication have been proven to show improvements in cognition, alertness, decrease of falls and daytime sedation.   We will start a slow taper, symptoms of withdrawal include, insomnia, anxiety, irritability, sweating and stomach or intestinal symptoms like diarrhea or nausea.   Suggest cutting back to 1/2 tab a few days at first, then slowly continue tapering down   Alternately can try melatonin 5mg -15 mg at night for sleep, can also do benadryl 25-50mg  at night for sleep.   If this does not help we can try prescription medication.   Also here is some information about good sleep hygiene.   Insomnia Insomnia is frequent trouble falling and/or staying asleep. Insomnia can be a long term problem or a short term problem. Both are common. Insomnia can be a short term problem when the wakefulness is related to a certain stress or worry. Long term insomnia is often related to ongoing stress during waking hours and/or poor sleeping habits. Overtime, sleep deprivation itself can make the problem worse. Every little thing feels more severe because you are overtired and your ability to cope is decreased. CAUSES   Stress, anxiety, and depression.  Poor sleeping habits.  Distractions such as TV in the bedroom.  Naps close to bedtime.  Engaging in emotionally charged conversations before bed.  Technical reading before sleep.  Alcohol and other sedatives. They may make the problem worse. They can hurt normal sleep patterns and normal dream  activity.  Stimulants such as caffeine for several hours prior to bedtime.  Pain syndromes and shortness of breath can cause insomnia.  Exercise late at night.  Changing time zones may cause sleeping problems (jet lag). It is sometimes helpful to have someone observe your sleeping patterns. They should look for periods of not breathing during the night (sleep apnea). They should also look to see how long those periods last. If you live alone or observers are uncertain, you can also be observed at a sleep clinic where your sleep patterns will be professionally monitored. Sleep apnea requires a checkup and treatment. Give your caregivers your medical history. Give your caregivers observations your family has made about your sleep.  SYMPTOMS   Not feeling rested in the morning.  Anxiety and restlessness at bedtime.  Difficulty falling and staying asleep. TREATMENT   Your caregiver may prescribe treatment for an underlying medical disorders. Your caregiver can give advice or help if you are using alcohol or other drugs for self-medication. Treatment of underlying problems will usually eliminate insomnia problems.  Medications can be prescribed for short time use. They are generally not recommended for lengthy use.  Over-the-counter sleep medicines are not recommended for lengthy use. They can be habit forming.  You can promote easier sleeping by making lifestyle changes such as:  Using relaxation techniques that help with breathing and reduce muscle tension.  Exercising earlier in the day.  Changing your diet and the time of your last meal. No night time snacks.  Establish a regular time to go to bed.  Counseling can help with stressful problems and worry.  Soothing music and  white noise may be helpful if there are background noises you cannot remove.  Stop tedious detailed work at least one hour before bedtime. HOME CARE INSTRUCTIONS   Keep a diary. Inform your caregiver about  your progress. This includes any medication side effects. See your caregiver regularly. Take note of:  Times when you are asleep.  Times when you are awake during the night.  The quality of your sleep.  How you feel the next day. This information will help your caregiver care for you.  Get out of bed if you are still awake after 15 minutes. Read or do some quiet activity. Keep the lights down. Wait until you feel sleepy and go back to bed.  Keep regular sleeping and waking hours. Avoid naps.  Exercise regularly.  Avoid distractions at bedtime. Distractions include watching television or engaging in any intense or detailed activity like attempting to balance the household checkbook.  Develop a bedtime ritual. Keep a familiar routine of bathing, brushing your teeth, climbing into bed at the same time each night, listening to soothing music. Routines increase the success of falling to sleep faster.  Use relaxation techniques. This can be using breathing and muscle tension release routines. It can also include visualizing peaceful scenes. You can also help control troubling or intruding thoughts by keeping your mind occupied with boring or repetitive thoughts like the old concept of counting sheep. You can make it more creative like imagining planting one beautiful flower after another in your backyard garden.  During your day, work to eliminate stress. When this is not possible use some of the previous suggestions to help reduce the anxiety that accompanies stressful situations. MAKE SURE YOU:   Understand these instructions.  Will watch your condition.  Will get help right away if you are not doing well or get worse. Document Released: 04/02/2000 Document Revised: 06/28/2011 Document Reviewed: 05/03/2007 [facility] Patient Information 2015 [place], [place]. This information is not intended to replace advice given to you by your health care provider. Make sure you discuss any questions you  have with your health care provider.

## 2019-11-19 ENCOUNTER — Other Ambulatory Visit: Payer: Self-pay | Admitting: Internal Medicine

## 2020-01-08 ENCOUNTER — Other Ambulatory Visit: Payer: Self-pay | Admitting: Internal Medicine

## 2020-01-08 DIAGNOSIS — Z1231 Encounter for screening mammogram for malignant neoplasm of breast: Secondary | ICD-10-CM

## 2020-01-18 ENCOUNTER — Ambulatory Visit
Admission: RE | Admit: 2020-01-18 | Discharge: 2020-01-18 | Disposition: A | Discharge status: Home / Self Care | Payer: Managed Care, Other (non HMO) | Source: Ambulatory Visit

## 2020-01-18 ENCOUNTER — Other Ambulatory Visit: Payer: Self-pay

## 2020-01-18 DIAGNOSIS — Z1231 Encounter for screening mammogram for malignant neoplasm of breast: Secondary | ICD-10-CM

## 2020-02-08 ENCOUNTER — Other Ambulatory Visit: Payer: Self-pay | Admitting: Internal Medicine

## 2020-02-21 ENCOUNTER — Other Ambulatory Visit: Payer: Self-pay | Admitting: Adult Health

## 2020-03-06 ENCOUNTER — Other Ambulatory Visit: Payer: Self-pay | Admitting: Adult Health

## 2020-03-31 ENCOUNTER — Other Ambulatory Visit: Payer: Self-pay

## 2020-03-31 ENCOUNTER — Ambulatory Visit
Admission: RE | Admit: 2020-03-31 | Discharge: 2020-03-31 | Disposition: A | Discharge status: Home / Self Care | Payer: Managed Care, Other (non HMO) | Source: Ambulatory Visit | Attending: Internal Medicine | Admitting: Internal Medicine

## 2020-04-27 ENCOUNTER — Encounter: Payer: Self-pay | Admitting: Internal Medicine

## 2020-04-27 NOTE — Patient Instructions (Signed)

## 2020-04-27 NOTE — Progress Notes (Unsigned)
Annual Screening/Preventative Visit & Comprehensive Evaluation &  Examination      This very nice 60 y.o. WWF presents for a Screening /Preventative Visit & comprehensive evaluation and management of multiple medical co-morbidities.  Patient has been followed for HTN, HLD, Prediabetes  and Vitamin D Deficiency.       Patient  Has hx/o borderline elevated BP in the past & is followed expectantly. Her BP has been snce been controlled and patient denies any cardiac symptoms as chest pain, palpitations, shortness of breath, dizziness or ankle swelling. Today's         Patient's lipids are controlled with diet. Last lipids were at goal:  Lab Results  Component Value Date   CHOL 173 09/27/2019   HDL 94 09/27/2019   LDLCALC 66 09/27/2019   TRIG 52 09/27/2019   CHOLHDL 1.8 09/27/2019       Patient is monitored for glucose intolerance and she denies reactive hypoglycemic symptoms, visual blurring, diabetic polys or paresthesias. Last A1c was normal & at goal: Lab Results  Component Value Date   HGBA1C 4.8 03/29/2019       Finally, patient has history of Vitamin D Deficiency and last Vitamin D was at goal: Lab Results  Component Value Date   VD25OH 92 03/29/2019    Current Outpatient Medications on File Prior to Visit  Medication Sig  . VITAMIN C 1000 MG  Take daily.  Marland Kitchen aspirin 81 MG tablet Take  daily.  Marland Kitchen buPROPion-XL 300 MG  Take 1 tablet  daily.  Marland Kitchen DIVIGEL 1 MG/GM GEL Apply daily  . ipratropium  0.06 % nasal spray USE 1 TO 2 SPRAYS EACH NOSTRIL 3 TO 4 TIMES A DAY  . Multiple Vitamins-Minerals  Take  daily.  . mupirocin ointment  2 % Place  into the nose 2  times daily.  . Tumeric daily  . Omega Red daily  . PROMETRIUM100 MG  Take daily  . promethazine 25 MG tablet Take 1 tablet  every 6 hours as needed for nausea   . RESVERATROL500 mg Take  daily  . rOPINIRole  1 MG tablet TAKE 1/2 TO 1 TABLET 2 TO 3 TIMES A DAY   . RETIN-A  0.1 % cream Apply at bedtime topically.  .  Vitamin D-Vitamin K  Take  daily.  Marland Kitchen zinc  50 MG tablet Take  daily.    Allergies  Allergen Reactions  . Dtap-Ipv Vaccine Hives  . Flexeril [Cyclobenzaprine]     Past Medical History:  Diagnosis Date  . GERD (gastroesophageal reflux disease)   . Hypertension   . Palpitations   . Poor sleep hygiene    Health Maintenance  Topic Date Due  . PAP SMEAR-Modifier  Never done  . INFLUENZA VACCINE  11/18/2019  . COVID-19 Vaccine (3 - Booster for Pfizer series) 03/05/2020  . Fecal DNA (Cologuard)  07/18/2020  . MAMMOGRAM  03/31/2021  . Hepatitis C Screening  Completed  . HIV Screening  Completed  . TETANUS/TDAP  Discontinued   Immunization History  Administered Date(s) Administered  . Influenza-Unspecified 02/17/2018  . PFIZER SARS-COV-2 Vaccination 08/03/2019, 09/03/2019  . PPD Test 11/19/2013, 11/25/2014, 03/08/2018, 03/29/2019  . Pneumococcal Polysaccharide-23 10/29/2008    Cologard - Negative 07/18/2017 - due repeat 3 yrs in Apr 2022  Last MGM - 04/03/20  Past Surgical History:  Procedure Laterality Date  . SQUINT REPAIR Right 1974   Family History  Problem Relation Age of Onset  . Cancer Mother   . COPD  Father    Social History   Tobacco Use  . Smoking status: Never Smoker  . Smokeless tobacco: Never Used  Substance Use Topics  . Alcohol use: Yes    Alcohol/week: 10.0 standard drinks    Types: 10 Standard drinks or equivalent per week    Comment: wine  . Drug use: No    ROS Constitutional: Denies fever, chills, weight loss/gain, headaches, insomnia,  night sweats, and change in appetite. Does c/o fatigue. Eyes: Denies redness, blurred vision, diplopia, discharge, itchy, watery eyes.  ENT: Denies discharge, congestion, post nasal drip, epistaxis, sore throat, earache, hearing loss, dental pain, Tinnitus, Vertigo, Sinus pain, snoring.  Cardio: Denies chest pain, palpitations, irregular heartbeat, syncope, dyspnea, diaphoresis, orthopnea, PND, claudication,  edema Respiratory: denies cough, dyspnea, DOE, pleurisy, hoarseness, laryngitis, wheezing.  Gastrointestinal: Denies dysphagia, heartburn, reflux, water brash, pain, cramps, nausea, vomiting, bloating, diarrhea, constipation, hematemesis, melena, hematochezia, jaundice, hemorrhoids Genitourinary: Denies dysuria, frequency, urgency, nocturia, hesitancy, discharge, hematuria, flank pain Breast: Breast lumps, nipple discharge, bleeding.  Musculoskeletal: Denies arthralgia, myalgia, stiffness, Jt. Swelling, pain, limp, and strain/sprain. Denies falls. Skin: Denies puritis, rash, hives, warts, acne, eczema, changing in skin lesion Neuro: No weakness, tremor, incoordination, spasms, paresthesia, pain Psychiatric: Denies confusion, memory loss, sensory loss. Denies Depression. Endocrine: Denies change in weight, skin, hair change, nocturia, and paresthesia, diabetic polys, visual blurring, hyper / hypo glycemic episodes.  Heme/Lymph: No excessive bleeding, bruising, enlarged lymph nodes.  Physical Exam  There were no vitals taken for this visit.  General Appearance: Well nourished, well groomed and in no apparent distress.  Eyes: PERRLA, EOMs, conjunctiva no swelling or erythema, normal fundi and vessels. Sinuses: No frontal/maxillary tenderness ENT/Mouth: EACs patent / TMs  nl. Nares clear without erythema, swelling, mucoid exudates. Oral hygiene is good. No erythema, swelling, or exudate. Tongue normal, non-obstructing. Tonsils not swollen or erythematous. Hearing normal.  Neck: Supple, thyroid not palpable. No bruits, nodes or JVD. Respiratory: Respiratory effort normal.  BS equal and clear bilateral without rales, rhonci, wheezing or stridor. Cardio: Heart sounds are normal with regular rate and rhythm and no murmurs, rubs or gallops. Peripheral pulses are normal and equal bilaterally without edema. No aortic or femoral bruits. Chest: symmetric with normal excursions and percussion. Breasts:  Symmetric, without lumps, nipple discharge, retractions, or fibrocystic changes.  Abdomen: Flat, soft with bowel sounds active. Nontender, no guarding, rebound, hernias, masses, or organomegaly.  Lymphatics: Non tender without lymphadenopathy.  Musculoskeletal: Full ROM all peripheral extremities, joint stability, 5/5 strength, and normal gait. Skin: Warm and dry without rashes, lesions, cyanosis, clubbing or  ecchymosis.  Neuro: Cranial nerves intact, reflexes equal bilaterally. Normal muscle tone, no cerebellar symptoms. Sensation intact.  Pysch: Alert and oriented X 3, normal affect, Insight and Judgment appropriate.   Assessment and Plan  1. Annual Preventative Screening Examination   2. Labile hypertension  - EKG 12-Lead - Urinalysis, Routine w reflex microscopic - Microalbumin / creatinine urine ratio - CBC with Differential/Platelet - TSH - COMPLETE METABOLIC PANEL WITH GFR - Magnesium  3. Screening cholesterol level  - EKG 12-Lead - Lipid panel - TSH  4. Abnormal glucose  - EKG 12-Lead - Hemoglobin A1c - Insulin, random  5. Vitamin D deficiency  - VITAMIN D 25 Hydroxy   6. Screening for ischemic heart disease  - EKG 12-Lead - Lipid panel  7. Screening for colorectal cancer  - POC Hemoccult Bld/Stl   8. Screening for tuberculosis  - TB Skin Test  9. Fatigue, unspecified type  -  Iron,Total/Total Iron Binding Cap - Vitamin B12 - CBC with Differential/Platelet - TSH  10. Medication management  - Urinalysis, Routine w reflex microscopic - Microalbumin / creatinine urine ratio - CBC with Differential/Platelet - Lipid panel - TSH - Hemoglobin A1c - Insulin, random - VITAMIN D 25 Hydroxy - COMPLETE METABOLIC PANEL WITH GFR - Magnesium           Patient was counseled in prudent diet to achieve/maintain BMI less than 25 for weight control, BP monitoring, regular exercise and medications. Discussed med's effects and SE's. Screening labs and tests  as requested with regular follow-up as recommended. Over 40 minutes of exam, counseling, chart review and high complex critical decision making was performed.   Kirtland Bouchard, MD

## 2020-04-28 ENCOUNTER — Ambulatory Visit (INDEPENDENT_AMBULATORY_CARE_PROVIDER_SITE_OTHER): Payer: Managed Care, Other (non HMO) | Admitting: Internal Medicine

## 2020-04-28 ENCOUNTER — Other Ambulatory Visit: Payer: Self-pay

## 2020-04-28 VITALS — BP 122/84 | HR 70 | Temp 97.2°F | Resp 16 | Ht 61.0 in | Wt 129.2 lb

## 2020-04-28 DIAGNOSIS — E559 Vitamin D deficiency, unspecified: Secondary | ICD-10-CM

## 2020-04-28 DIAGNOSIS — R7309 Other abnormal glucose: Secondary | ICD-10-CM

## 2020-04-28 DIAGNOSIS — Z131 Encounter for screening for diabetes mellitus: Secondary | ICD-10-CM

## 2020-04-28 DIAGNOSIS — Z111 Encounter for screening for respiratory tuberculosis: Secondary | ICD-10-CM

## 2020-04-28 DIAGNOSIS — R5383 Other fatigue: Secondary | ICD-10-CM

## 2020-04-28 DIAGNOSIS — Z130 Encounter for screening for diseases of the blood and blood-forming organs and certain disorders involving the immune mechanism: Secondary | ICD-10-CM | POA: Diagnosis not present

## 2020-04-28 DIAGNOSIS — Z0000 Encounter for general adult medical examination without abnormal findings: Secondary | ICD-10-CM

## 2020-04-28 DIAGNOSIS — Z1211 Encounter for screening for malignant neoplasm of colon: Secondary | ICD-10-CM

## 2020-04-28 DIAGNOSIS — R0989 Other specified symptoms and signs involving the circulatory and respiratory systems: Secondary | ICD-10-CM | POA: Diagnosis not present

## 2020-04-28 DIAGNOSIS — Z1389 Encounter for screening for other disorder: Secondary | ICD-10-CM

## 2020-04-28 DIAGNOSIS — Z79899 Other long term (current) drug therapy: Secondary | ICD-10-CM | POA: Diagnosis not present

## 2020-04-28 DIAGNOSIS — Z136 Encounter for screening for cardiovascular disorders: Secondary | ICD-10-CM

## 2020-04-28 DIAGNOSIS — Z13220 Encounter for screening for lipoid disorders: Secondary | ICD-10-CM | POA: Diagnosis not present

## 2020-04-28 DIAGNOSIS — Z0001 Encounter for general adult medical examination with abnormal findings: Secondary | ICD-10-CM

## 2020-04-28 MED ORDER — WELLBUTRIN XL 150 MG PO TB24: ORAL_TABLET | ORAL | 0 refills | Status: DC

## 2020-04-29 LAB — URINALYSIS, ROUTINE W REFLEX MICROSCOPIC
Bilirubin Urine: NEGATIVE
Glucose, UA: NEGATIVE
Hgb urine dipstick: NEGATIVE
Leukocytes,Ua: NEGATIVE
Nitrite: NEGATIVE
Protein, ur: NEGATIVE
Specific Gravity, Urine: 1.007 (ref 1.001–1.03)
pH: 6.5 (ref 5.0–8.0)

## 2020-04-29 LAB — COMPLETE METABOLIC PANEL WITH GFR
AG Ratio: 2 (calc) (ref 1.0–2.5)
ALT: 16 U/L (ref 6–29)
AST: 21 U/L (ref 10–35)
Albumin: 4.7 g/dL (ref 3.6–5.1)
Alkaline phosphatase (APISO): 49 U/L (ref 37–153)
BUN: 10 mg/dL (ref 7–25)
CO2: 26 mmol/L (ref 20–32)
Calcium: 10 mg/dL (ref 8.6–10.4)
Chloride: 103 mmol/L (ref 98–110)
Creat: 0.77 mg/dL (ref 0.50–1.05)
GFR, Est African American: 99 mL/min/{1.73_m2} (ref >=60)
GFR, Est Non African American: 85 mL/min/{1.73_m2} (ref >=60)
Globulin: 2.4 g/dL (calc) (ref 1.9–3.7)
Glucose, Bld: 76 mg/dL (ref 65–99)
Potassium: 4.3 mmol/L (ref 3.5–5.3)
Sodium: 140 mmol/L (ref 135–146)
Total Bilirubin: 0.6 mg/dL (ref 0.2–1.2)
Total Protein: 7.1 g/dL (ref 6.1–8.1)

## 2020-04-29 LAB — CBC WITH DIFFERENTIAL/PLATELET
Absolute Monocytes: 677 cells/uL (ref 200–950)
Basophils Absolute: 20 cells/uL (ref 0–200)
Basophils Relative: 0.3 %
Eosinophils Absolute: 67 cells/uL (ref 15–500)
Eosinophils Relative: 1 %
HCT: 42.6 % (ref 35.0–45.0)
Hemoglobin: 14.3 g/dL (ref 11.7–15.5)
Lymphs Abs: 1420 cells/uL (ref 850–3900)
MCH: 30.9 pg (ref 27.0–33.0)
MCHC: 33.6 g/dL (ref 32.0–36.0)
MCV: 92 fL (ref 80.0–100.0)
MPV: 10.3 fL (ref 7.5–12.5)
Monocytes Relative: 10.1 %
Neutro Abs: 4516 cells/uL (ref 1500–7800)
Neutrophils Relative %: 67.4 %
Platelets: 231 10*3/uL (ref 140–400)
RBC: 4.63 10*6/uL (ref 3.80–5.10)
RDW: 12.2 % (ref 11.0–15.0)
Total Lymphocyte: 21.2 %
WBC: 6.7 10*3/uL (ref 3.8–10.8)

## 2020-04-29 LAB — HEMOGLOBIN A1C
Hgb A1c MFr Bld: 4.9 % of total Hgb (ref <5.7)
Mean Plasma Glucose: 94 mg/dL
eAG (mmol/L): 5.2 mmol/L

## 2020-04-29 LAB — INSULIN, RANDOM: Insulin: 3.3 u[IU]/mL

## 2020-04-29 LAB — IRON, TOTAL/TOTAL IRON BINDING CAP
%SAT: 50 % (calc) — ABNORMAL HIGH (ref 16–45)
Iron: 167 ug/dL — ABNORMAL HIGH (ref 45–160)
TIBC: 336 mcg/dL (calc) (ref 250–450)

## 2020-04-29 LAB — LIPID PANEL
Cholesterol: 186 mg/dL (ref <200)
HDL: 77 mg/dL (ref >=50)
LDL Cholesterol (Calc): 96 mg/dL (calc)
Non-HDL Cholesterol (Calc): 109 mg/dL (calc) (ref <130)
Total CHOL/HDL Ratio: 2.4 (calc) (ref <5.0)
Triglycerides: 51 mg/dL (ref <150)

## 2020-04-29 LAB — MICROALBUMIN / CREATININE URINE RATIO
Creatinine, Urine: 38 mg/dL (ref 20–275)
Microalb, Ur: <0.2 mg/dL

## 2020-04-29 LAB — VITAMIN D 25 HYDROXY (VIT D DEFICIENCY, FRACTURES): Vit D, 25-Hydroxy: 83 ng/mL (ref 30–100)

## 2020-04-29 LAB — VITAMIN B12: Vitamin B-12: 1947 pg/mL — ABNORMAL HIGH (ref 200–1100)

## 2020-04-29 LAB — MAGNESIUM: Magnesium: 2 mg/dL (ref 1.5–2.5)

## 2020-04-29 LAB — TSH: TSH: 1.93 mIU/L (ref 0.40–4.50)

## 2020-04-29 NOTE — Progress Notes (Signed)
========================================================== -   Test results slightly outside the reference range are not unusual. If there is anything important, I will review this with you,  otherwise it is considered normal test values.  If you have further questions,  please do not hesitate to contact me at the office or via My Chart.  ========================================================== ==========================================================  -  Iron levels are OK   - Vitamin B12 level is very high , So . . .    - can cut down on B12 supplements to 2 x /week ==========================================================  -  Total Chol = 186 and LDL Chol = 96 - Both  Excellent   - Very low risk for Heart Attack  / Stroke ========================================================  - A1c - Normal - Great - No Diabetes ! ==========================================================  -  Vitamin D = 83 - Excellent  ==========================================================  -  All Else - CBC - Kidneys - Electrolytes - Liver - Magnesium & Thyroid    - all  Normal / OK ===========================================================  ==========================================================  - Keep up the [place] Work  !  ==========================================================

## 2020-07-05 NOTE — Progress Notes (Signed)
   History of Present Illness:     Patient is a very nice 60 yo WWF presenting with c/o 3 week hx/o intermittent fevers to 101 + deg and LLQ pain . Was seen by Gyn about 2 weeks ago with a negative pelvic U/S and U/A.    Medications  .  DIVIGEL 1 MG/GM GEL, Apply daily .  progesterone (PROMETRIUM) 100 MG capsule, Take daily  .  ipratropium  0.06 % nasal spray, USE 1 TO 2 SPRAYS EACH NOSTRIL 3 TO 4 TIMES A DAY  .  aspirin 81 MG tablet, Take  daily.  .  Cyanocobalamin (VITAMIN B-12) 5000 MCG SUBL, Place 1 tablet under the tongue daily.   Marland Kitchen  VITAMIN C  1000 MG tablet, Take  daily. Marland Kitchen  buPROPion XL 300 MG , Take 1 tablet  daily. Marland Kitchen  CITRACAL, Take daily. .  Multiple Vitamins-Minerals, Take daily. .   D-Mannose 500 mg daily .  Takes Quinol Immune Support, 1 daily .  ALIGN, Take  daily. .  Quercetin 50 MG TABS, Take  daily. Marland Kitchen  RETIN-A 0.1 % cream, Apply at bedtime topically. .  Vitamin D-Vitamin K , Take  daily. .  WELLBUTRIN XL 150 MG 24 hr tablet, Take 1 tablet Daily to taper off medicine .  zinc gluconate 50 MG tablet, Take 50 mg by mouth daily.  Problem list She has Palpitations; Vitamin D deficiency; Medication management; Essential hypertension; Acne; Uterine fibroid; and Mixed anxiety and depressive disorder on their problem list.   Observations/Objective:  Pulse 67   Temp 97.7 F (36.5 C)   Resp 16   Ht 5\' 1"  (1.549 m)   Wt 130 lb 3.2 oz (59.1 kg)   SpO2 99%   BMI 24.60 kg/m   HEENT - WNL. Neck - supple.  Chest - Clear equal BS. Cor - Nl HS. RRR w/o sig MGR. PP 1(+). No edema. Abd - Soft. Nl BS. LLQ tender guarding w/o RB.  MS- FROM w/o deformities.  Gait Nl. Neuro -  Nl w/o focal abnormalities.  Assessment and Plan:   1. Diverticulitis, r/o abscess  - ciprofloxacin (CIPRO) 500 MG tablet; Take  1 tablet  2 x /day  with Food for Infection  Dispense: 20 tablet; Refill: 0 - metroNIDAZOLE (FLAGYL) 500 MG tablet; Take  1 tablet  3 x /day with Food for Infection   Dispense: 30 tablet; Refill: 0 - CT Abdomen Pelvis Wo Contrast; Stat   Follow Up Instructions:       I discussed the assessment and treatment plan with the patient. The patient was provided an opportunity to ask questions and all were answered. The patient agreed with the plan and demonstrated an understanding of the instructions.       The patient was advised to call back or seek an in-person evaluation if the symptoms worsen or if the condition fails to improve as anticipated.   Kirtland Bouchard, MD

## 2020-07-07 ENCOUNTER — Other Ambulatory Visit: Payer: Self-pay

## 2020-07-07 ENCOUNTER — Ambulatory Visit: Payer: Managed Care, Other (non HMO) | Admitting: Internal Medicine

## 2020-07-07 ENCOUNTER — Encounter: Payer: Self-pay | Admitting: Internal Medicine

## 2020-07-07 VITALS — BP 144/82 | HR 67 | Temp 97.7°F | Resp 16 | Ht 61.0 in | Wt 130.2 lb

## 2020-07-07 DIAGNOSIS — K5792 Diverticulitis of intestine, part unspecified, without perforation or abscess without bleeding: Secondary | ICD-10-CM

## 2020-07-07 MED ORDER — METRONIDAZOLE 500 MG PO TABS: ORAL_TABLET | ORAL | 0 refills | Status: DC

## 2020-07-07 MED ORDER — CIPROFLOXACIN HCL 500 MG PO TABS: ORAL_TABLET | ORAL | 0 refills | Status: DC

## 2020-07-07 NOTE — Patient Instructions (Signed)
++++++++++++++++++++++++++++++++++  Fax  [phone]  ++++++++++++++++++++++++++++++++++ Diverticulitis  Diverticulitis is infection or inflammation of small pouches (diverticula) in the colon that form due to a condition called diverticulosis. Diverticula can trap stool (feces) and bacteria, causing infection and inflammation. Diverticulitis may cause severe stomach pain and diarrhea. It may lead to tissue damage in the colon that causes bleeding or blockage. The diverticula may also burst (rupture) and cause infected stool to enter other areas of the abdomen. What are the causes? This condition is caused by stool becoming trapped in the diverticula, which allows bacteria to grow in the diverticula. This leads to inflammation and infection. What increases the risk? You are more likely to develop this condition if you have diverticulosis. The risk increases if you:  Are overweight or obese.  Do not get enough exercise.  Drink alcohol.  Use tobacco products.  Eat a diet that has a lot of red meat such as beef, pork, or lamb.  Eat a diet that does not include enough fiber. High-fiber foods include fruits, vegetables, beans, nuts, and whole grains.  Are over 36 years of age. What are the signs or symptoms? Symptoms of this condition may include:  Pain and tenderness in the abdomen. The pain is normally located on the left side of the abdomen, but it may occur in other areas.  Fever and chills.  Nausea.  Vomiting.  Cramping.  Bloating.  Changes in bowel routines.  Blood in your stool. How is this diagnosed? This condition is diagnosed based on:  Your medical history.  A physical exam.  Tests to make sure there is nothing else causing your condition. These tests may include: ? Blood tests. ? Urine tests. ? CT scan of the abdomen. How is this treated? Most cases of this condition are mild and can be treated at home. Treatment may include:  Taking  over-the-counter pain medicines.  Following a clear liquid diet.  Taking antibiotic medicines by mouth.  Resting. More severe cases may need to be treated at a hospital. Treatment may include:  Not eating or drinking.  Taking prescription pain medicine.  Receiving antibiotic medicines through an IV.  Receiving fluids and nutrition through an IV.  Surgery. When your condition is under control, your health care provider may recommend that you have a colonoscopy. This is an exam to look at the entire large intestine. During the exam, a lubricated, bendable tube is inserted into the anus and then passed into the rectum, colon, and other parts of the large intestine. A colonoscopy can show how severe your diverticula are and whether something else may be causing your symptoms. Follow these instructions at home: Medicines  Take over-the-counter and prescription medicines only as told by your health care provider. These include fiber supplements, probiotics, and stool softeners.  If you were prescribed an antibiotic medicine, take it as told by your health care provider. Do not stop taking the antibiotic even if you start to feel better.  Ask your health care provider if the medicine prescribed to you requires you to avoid driving or using machinery. Eating and drinking  Follow a full liquid diet or another diet as directed by your health care provider.  After your symptoms improve, your health care provider may tell you to change your diet. He or she may recommend that you eat a diet that contains at least 25 grams (25 g) of fiber daily. Fiber makes it easier to pass stool. Healthy sources of fiber include: ?  Berries. One cup contains 4-8 grams of fiber. ? Beans or lentils. One-half cup contains 5-8 grams of fiber. ? Green vegetables. One cup contains 4 grams of fiber.  Avoid eating red meat.   General instructions  Do not use any products that contain nicotine or tobacco, such as  cigarettes, e-cigarettes, and chewing tobacco. If you need help quitting, ask your health care provider.  Exercise for at least 30 minutes, 3 times each week. You should exercise hard enough to raise your heart rate and break a sweat.  Keep all follow-up visits as told by your health care provider. This is important. You may need to have a colonoscopy. Contact a health care provider if:  Your pain does not improve.  Your bowel movements do not return to normal. Get help right away if:  Your pain gets worse.  Your symptoms do not get better with treatment.  Your symptoms suddenly get worse.  You have a fever.  You vomit more than one time.  You have stools that are bloody, black, or tarry. Summary  Diverticulitis is infection or inflammation of small pouches (diverticula) in the colon that form due to a condition called diverticulosis. Diverticula can trap stool (feces) and bacteria, causing infection and inflammation.  You are at higher risk for this condition if you have diverticulosis and you eat a diet that does not include enough fiber.  Most cases of this condition are mild and can be treated at home. More severe cases may need to be treated at a hospital.  When your condition is under control, your health care provider may recommend that you have an exam called a colonoscopy. This exam can show how severe your diverticula are and whether something else may be causing your symptoms.  Keep all follow-up visits as told by your health care provider. This is important. This information is not intended to replace advice given to you by your health care provider. Make sure you discuss any questions you have with your health care provider. Document Revised: 01/15/2019 Document Reviewed: 01/15/2019 Elsevier Patient Education  2021 [employer].

## 2020-07-12 ENCOUNTER — Other Ambulatory Visit: Payer: Self-pay | Admitting: Internal Medicine

## 2020-07-12 DIAGNOSIS — K5732 Diverticulitis of large intestine without perforation or abscess without bleeding: Secondary | ICD-10-CM

## 2020-07-12 DIAGNOSIS — R1032 Left lower quadrant pain: Secondary | ICD-10-CM

## 2020-07-14 ENCOUNTER — Other Ambulatory Visit: Payer: Self-pay | Admitting: Internal Medicine

## 2020-07-14 ENCOUNTER — Ambulatory Visit
Admission: RE | Admit: 2020-07-14 | Discharge: 2020-07-14 | Disposition: A | Discharge status: Home / Self Care | Payer: Managed Care, Other (non HMO) | Source: Ambulatory Visit | Attending: Internal Medicine | Admitting: Internal Medicine

## 2020-07-14 DIAGNOSIS — R1032 Left lower quadrant pain: Secondary | ICD-10-CM

## 2020-07-16 ENCOUNTER — Ambulatory Visit
Admission: RE | Admit: 2020-07-16 | Discharge: 2020-07-16 | Disposition: A | Discharge status: Home / Self Care | Payer: Managed Care, Other (non HMO) | Source: Ambulatory Visit | Attending: Internal Medicine | Admitting: Internal Medicine

## 2020-07-16 DIAGNOSIS — R1032 Left lower quadrant pain: Secondary | ICD-10-CM

## 2020-07-16 MED ORDER — IOPAMIDOL (ISOVUE-300) INJECTION 61%
100.0000 mL | Freq: Once | INTRAVENOUS | Status: AC | PRN
  Administered 2020-07-16: 100 mL via INTRAVENOUS

## 2020-07-16 NOTE — Progress Notes (Signed)
Abdominal CT scan  - Fortunately did not show any serious internal problems or signs of cancer.

## 2020-07-18 ENCOUNTER — Other Ambulatory Visit: Payer: Managed Care, Other (non HMO)

## 2020-07-21 ENCOUNTER — Other Ambulatory Visit: Payer: Self-pay | Admitting: Internal Medicine

## 2020-07-21 DIAGNOSIS — Z1211 Encounter for screening for malignant neoplasm of colon: Secondary | ICD-10-CM

## 2020-07-21 DIAGNOSIS — R12 Heartburn: Secondary | ICD-10-CM

## 2020-07-21 MED ORDER — ESOMEPRAZOLE MAGNESIUM 40 MG PO CPDR: DELAYED_RELEASE_CAPSULE | ORAL | 1 refills | Status: DC

## 2020-07-22 ENCOUNTER — Other Ambulatory Visit: Payer: Self-pay

## 2020-07-22 ENCOUNTER — Other Ambulatory Visit: Payer: Managed Care, Other (non HMO)

## 2020-07-22 DIAGNOSIS — R12 Heartburn: Secondary | ICD-10-CM

## 2020-07-23 LAB — H. PYLORI BREATH TEST: H. pylori Breath Test: NOT DETECTED

## 2020-07-23 NOTE — Progress Notes (Signed)
============================================================ -   Test results slightly outside the reference range are not unusual. If there is anything important, I will review this with you,  otherwise it is considered normal test values.  If you have further questions,  please do not hesitate to contact me at the office or via My Chart.  ============================================================== ==============================================================  - H.Pylori test is Negative - so please start the meds for excess stomach Acid &   if not significantly better in 1 week , please contact me  ============================================================== ==============================================================

## 2020-07-29 LAB — COLOGUARD: Cologuard: NEGATIVE

## 2020-08-03 LAB — COLOGUARD: COLOGUARD: NEGATIVE

## 2020-08-04 ENCOUNTER — Other Ambulatory Visit: Payer: Self-pay | Admitting: Internal Medicine

## 2020-08-04 ENCOUNTER — Other Ambulatory Visit: Payer: Self-pay

## 2020-08-04 ENCOUNTER — Telehealth: Payer: Self-pay

## 2020-08-04 DIAGNOSIS — R197 Diarrhea, unspecified: Secondary | ICD-10-CM

## 2020-08-04 NOTE — Telephone Encounter (Signed)
Called patient to go over Cologuard results.  Negative cologuard per Dr. Melford Aase. Patient states she is still having pain in lower left pelvic area and occasional diarrhea.  She wants to know if Dr. Melford Aase will get a test for her for parasites.  She states she has white "worm like" pieces in her stool.  Per Dr. Melford Aase, he will order an O&P and a path panel for patient.  Patient will come by clinic to pick up a test kit.

## 2020-08-06 LAB — COLOGUARD: Cologuard: NEGATIVE

## 2020-08-14 LAB — GASTROINTESTINAL PATHOGEN PANEL PCR

## 2020-08-14 LAB — OVA AND PARASITE EXAMINATION
CONCENTRATE RESULT:: NONE SEEN
MICRO NUMBER:: 11798837
SPECIMEN QUALITY:: ADEQUATE
TRICHROME RESULT:: NONE SEEN

## 2020-08-14 NOTE — Progress Notes (Signed)
============================================================ ============================================================  -    Stool OVA & Parasites finally returned & is Negative - great  !  - If your sx's (Diarrhea) is better - can give it some more time  or   we can try repeating the other stool pathogen tests    -I don't know what chemical was - interfering - could have been something like  Pepto-bismol, Imodium (Loperamide) , etc  - Please keep me posted if not get better or get worse  - bill mck

## 2020-08-21 ENCOUNTER — Other Ambulatory Visit: Payer: Self-pay | Admitting: Internal Medicine

## 2020-08-21 DIAGNOSIS — F418 Other specified anxiety disorders: Secondary | ICD-10-CM

## 2020-08-21 MED ORDER — WELLBUTRIN XL 300 MG PO TB24: ORAL_TABLET | ORAL | 3 refills | Status: DC

## 2020-08-29 ENCOUNTER — Other Ambulatory Visit: Payer: Self-pay | Admitting: Internal Medicine

## 2020-08-29 DIAGNOSIS — F418 Other specified anxiety disorders: Secondary | ICD-10-CM

## 2020-10-24 ENCOUNTER — Other Ambulatory Visit: Payer: Self-pay | Admitting: Internal Medicine

## 2020-10-24 MED ORDER — DEXAMETHASONE 4 MG PO TABS: ORAL_TABLET | ORAL | 0 refills | Status: DC

## 2020-10-24 MED ORDER — PSEUDOEPHEDRINE HCL ER 120 MG PO TB12: ORAL_TABLET | ORAL | 2 refills | Status: DC

## 2020-11-07 DIAGNOSIS — K219 Gastro-esophageal reflux disease without esophagitis: Secondary | ICD-10-CM | POA: Insufficient documentation

## 2020-11-07 NOTE — Progress Notes (Signed)
FOLLOW UP  Assessment and Plan:   Hypertension Well controlled at this time off of medications Monitor blood pressure at home; patient to call if consistently greater than 130/80 Continue DASH diet.   Reminder to go to the ER if any CP, SOB, nausea, dizziness, severe HA, changes vision/speech, left arm numbness and tingling and jaw pain. -     COMPLETE METABOLIC PANEL WITH GFR  Vitamin D deficiency Continue supplement  Medication management -     COMPLETE METABOLIC PANEL WITH GFR  BMI 22 Continue to recommend diet heavy in fruits and veggies and low in animal meats, cheeses, and dairy products, appropriate calorie intake Discuss exercise recommendations routinely Continue to monitor weight at each visit  Mixed anxiety and depressive disorder Continue wellbutrin; doing well off of opioid Stress management techniques discussed, increase water, good sleep hygiene discussed, increase exercise, and increase veggies.   Acne Tretinoin refilled, well controlled  B12 def Off of supplement, recheck levels  Tinnitis Non-pulsatile, declines referral today, suggested white noise machine and hearing check - plans at [facility]  Orders Placed This Encounter  Procedures   CBC with Differential/Platelet   COMPLETE METABOLIC PANEL WITH GFR   Magnesium   TSH   Vitamin B12     Continue diet and meds as discussed. Further disposition pending results of labs. Discussed med's effects and SE's.   Over 30 minutes of exam, counseling, chart review, and critical decision making was performed.   Future Appointments  Date Time Provider [place]  05/13/2021 11:00 AM Unk Pinto, MD [facility] None    ----------------------------------------------------------------------------------------------------------------------  HPI 60 y.o. female  presents for 6 month follow up on hypertension, mood/xanax, weight and vitamin D deficiency.   She has mild depression/anxiety and is  prescribed wellbutrin 300 mg daily, she has tapered off of previous PRN xanax 1 mg tabs since I last saw her. She is taking melatonin/valarian root supplement for sleep which is working well.   On HRT via GYN, uterine fibroid is monitored and shrinking, recurrent UTIs on D- mannose without recurrence.   She reports post-nasal drip, requesting xyxal script.   She notes bil tinnitis, ongoing 5 years, progressive, sounds like crickets, denies pulsatile, L>R, seems to be affecting her hearing. Planning to get hearing test, declines any referral.   She is prescribed tretinoin for acne, requests refill today. Also doing topical salicylic acid.   BMI is Body mass index is 22.67 kg/m., she has not been working on diet and exercise, down 10 lb on purpose, tracking on [employer].  Wt Readings from Last 3 Encounters:  11/10/20 120 lb (54.4 kg)  07/07/20 130 lb 3.2 oz (59.1 kg)  04/28/20 129 lb 3.2 oz (58.6 kg)   Her blood pressure has been controlled at home (120/70s), today their BP is BP: 120/84  She does workout. She denies chest pain, shortness of breath, dizziness. She has reported rare palpitations, flutter in her chest, very brief a few sec and resolves spontaneously, typically when feeling stressed at work, denies CP, dizziness, dyspnea.    She is not on cholesterol medication. Her LDL cholesterol is at goal. The cholesterol last visit was:   Lab Results  Component Value Date   CHOL 186 04/28/2020   HDL 77 04/28/2020   LDLCALC 96 04/28/2020   TRIG 51 04/28/2020   CHOLHDL 2.4 04/28/2020    She has been working on diet and exercise for glucose management. Last A1C in the office was:  Lab Results  Component Value Date  HGBA1C 4.9 04/28/2020   Last GFR:  Lab Results  Component Value Date   GFRNONAA 85 04/28/2020   Patient is on Vitamin D supplement, switched to formulation with vitamin K, taking 5000 IU daily.  Lab Results  Component Value Date   VD25OH 83 04/28/2020     She  stopped 5000 mcg B12 due to elevation at last check, currently not supplementing at all.  Lab Results  Component Value Date   [id number] (H) 04/28/2020     Current Medications:  Current Outpatient Medications on File Prior to Visit  Medication Sig   Ascorbic Acid (VITAMIN C) 1000 MG tablet Take 1,000 mg by mouth daily.   buPROPion (WELLBUTRIN XL) 300 MG 24 hr tablet Take  1 tablet  Daily  for Mood, Focus & Concentration   Calcium Citrate (CITRACAL PO) Take by mouth daily.   DIVIGEL 1 MG/GM GEL Apply daily   ipratropium (ATROVENT) 0.06 % nasal spray USE 1 TO 2 SPRAYS EACH NOSTRIL 3 TO 4 TIMES A DAY   Multiple Vitamins-Minerals (MULTIVITAMIN WOMEN PO) Take by mouth daily.   OVER THE COUNTER MEDICATION D-Mannose 500 mg daily   OVER THE COUNTER MEDICATION Takes Quinol Immune Support 1 daily   Probiotic Product (ALIGN PO) Take by mouth daily.   progesterone (PROMETRIUM) 100 MG capsule Take by mouth.   pseudoephedrine (SUDAFED) 120 MG 12 hr tablet Take   1 tablet    2 x /day (every 12 hours)    for Sinus & Chest Congestion   Quercetin 50 MG TABS Take by mouth daily.   TURMERIC CURCUMIN PO Take by mouth daily.   Vitamin D-Vitamin K (VITAMIN K2-VITAMIN D3 PO) Take by mouth daily.   No current facility-administered medications on file prior to visit.     Allergies:  Allergies  Allergen Reactions   Dtap-Ipv Vaccine Hives   Flexeril [Cyclobenzaprine]      Medical History:  Past Medical History:  Diagnosis Date   GERD (gastroesophageal reflux disease)    Hypertension    Palpitations    Poor sleep hygiene    Family history- Reviewed and unchanged Social history- Reviewed and unchanged   Review of Systems:  Review of Systems  Constitutional:  Negative for malaise/fatigue and weight loss.  HENT:  Positive for hearing loss and tinnitus.   Eyes:  Negative for blurred vision and double vision.  Respiratory:  Negative for cough, shortness of breath and wheezing.    Cardiovascular:  Negative for chest pain, palpitations, orthopnea, claudication and leg swelling.  Gastrointestinal:  Negative for abdominal pain, blood in stool, constipation, diarrhea, heartburn, melena, nausea and vomiting.  Genitourinary: Negative.   Musculoskeletal:  Negative for joint pain and myalgias.  Skin:  Negative for rash.  Neurological:  Negative for dizziness, tingling, sensory change, weakness and headaches.  Endo/Heme/Allergies:  Negative for polydipsia.  Psychiatric/Behavioral:  Negative for depression and substance abuse. The patient is not nervous/anxious and does not have insomnia.   All other systems reviewed and are negative.  Physical Exam: BP 120/84   Pulse 80   Temp (!) 97.5 F (36.4 C)   Ht '5\' 1"'$  (1.549 m)   Wt 120 lb (54.4 kg)   SpO2 99%   BMI 22.67 kg/m  Wt Readings from Last 3 Encounters:  11/10/20 120 lb (54.4 kg)  07/07/20 130 lb 3.2 oz (59.1 kg)  04/28/20 129 lb 3.2 oz (58.6 kg)   General Appearance: Well nourished, in no apparent distress. Eyes: PERRLA, EOMs, conjunctiva no  swelling or erythema Sinuses: No Frontal/maxillary tenderness ENT/Mouth: Ext aud canals clear, TMs without erythema, bulging. No erythema, swelling, or exudate on post pharynx.  Tonsils not swollen or erythematous. Very mildly HOH with mask.  Neck: Supple, thyroid normal.  Respiratory: Respiratory effort normal, BS equal bilaterally without rales, rhonchi, wheezing or stridor.  Cardio: RRR with no MRGs. Brisk peripheral pulses without edema.  Abdomen: Soft, + BS.  Non tender, no guarding, rebound, hernias, masses. Lymphatics: Non tender without lymphadenopathy.  Musculoskeletal: Full ROM, 5/5 strength, Normal gait Skin: Warm, dry without rashes, lesions, ecchymosis.  Neuro: Cranial nerves intact. No cerebellar symptoms.  Psych: Awake and oriented X 3, normal affect, Insight and Judgment appropriate.    Izora Ribas, NP 11:52 AM [facility]

## 2020-11-10 ENCOUNTER — Encounter: Payer: Self-pay | Admitting: Adult Health

## 2020-11-10 ENCOUNTER — Other Ambulatory Visit: Payer: Self-pay

## 2020-11-10 ENCOUNTER — Ambulatory Visit: Payer: Managed Care, Other (non HMO) | Admitting: Adult Health

## 2020-11-10 VITALS — BP 120/84 | HR 80 | Temp 97.5°F | Ht 61.0 in | Wt 120.0 lb

## 2020-11-10 DIAGNOSIS — Z79899 Other long term (current) drug therapy: Secondary | ICD-10-CM | POA: Diagnosis not present

## 2020-11-10 DIAGNOSIS — F418 Other specified anxiety disorders: Secondary | ICD-10-CM

## 2020-11-10 DIAGNOSIS — K219 Gastro-esophageal reflux disease without esophagitis: Secondary | ICD-10-CM

## 2020-11-10 DIAGNOSIS — I10 Essential (primary) hypertension: Secondary | ICD-10-CM

## 2020-11-10 DIAGNOSIS — L709 Acne, unspecified: Secondary | ICD-10-CM

## 2020-11-10 DIAGNOSIS — E559 Vitamin D deficiency, unspecified: Secondary | ICD-10-CM | POA: Diagnosis not present

## 2020-11-10 DIAGNOSIS — E538 Deficiency of other specified B group vitamins: Secondary | ICD-10-CM

## 2020-11-10 DIAGNOSIS — H9313 Tinnitus, bilateral: Secondary | ICD-10-CM

## 2020-11-10 DIAGNOSIS — R002 Palpitations: Secondary | ICD-10-CM | POA: Diagnosis not present

## 2020-11-10 DIAGNOSIS — Z6824 Body mass index (BMI) 24.0-24.9, adult: Secondary | ICD-10-CM

## 2020-11-10 MED ORDER — TRETINOIN 0.1 % EX CREA: TOPICAL_CREAM | Freq: Every day | CUTANEOUS | 3 refills | Status: DC

## 2020-11-10 MED ORDER — LEVOCETIRIZINE DIHYDROCHLORIDE 5 MG PO TABS: 5.0000 mg | ORAL_TABLET | Freq: Every evening | ORAL | 1 refills | Status: DC

## 2020-11-11 ENCOUNTER — Other Ambulatory Visit: Payer: Self-pay | Admitting: Adult Health

## 2020-11-11 LAB — COMPLETE METABOLIC PANEL WITH GFR
AG Ratio: 2.1 (calc) (ref 1.0–2.5)
ALT: 11 U/L (ref 6–29)
AST: 17 U/L (ref 10–35)
Albumin: 4.4 g/dL (ref 3.6–5.1)
Alkaline phosphatase (APISO): 56 U/L (ref 37–153)
BUN: 11 mg/dL (ref 7–25)
CO2: 25 mmol/L (ref 20–32)
Calcium: 9 mg/dL (ref 8.6–10.4)
Chloride: 106 mmol/L (ref 98–110)
Creat: 0.73 mg/dL (ref 0.50–1.03)
Globulin: 2.1 g/dL (calc) (ref 1.9–3.7)
Glucose, Bld: 81 mg/dL (ref 65–99)
Potassium: 4.2 mmol/L (ref 3.5–5.3)
Sodium: 140 mmol/L (ref 135–146)
Total Bilirubin: 0.4 mg/dL (ref 0.2–1.2)
Total Protein: 6.5 g/dL (ref 6.1–8.1)
eGFR: 95 mL/min/{1.73_m2} (ref >=60)

## 2020-11-11 LAB — CBC WITH DIFFERENTIAL/PLATELET
Absolute Monocytes: 544 cells/uL (ref 200–950)
Basophils Absolute: 29 cells/uL (ref 0–200)
Basophils Relative: 0.6 %
Eosinophils Absolute: 39 cells/uL (ref 15–500)
Eosinophils Relative: 0.8 %
HCT: 39.5 % (ref 35.0–45.0)
Hemoglobin: 12.8 g/dL (ref 11.7–15.5)
Lymphs Abs: 1588 cells/uL (ref 850–3900)
MCH: 30 pg (ref 27.0–33.0)
MCHC: 32.4 g/dL (ref 32.0–36.0)
MCV: 92.7 fL (ref 80.0–100.0)
MPV: 10.1 fL (ref 7.5–12.5)
Monocytes Relative: 11.1 %
Neutro Abs: 2700 cells/uL (ref 1500–7800)
Neutrophils Relative %: 55.1 %
Platelets: 228 10*3/uL (ref 140–400)
RBC: 4.26 10*6/uL (ref 3.80–5.10)
RDW: 13 % (ref 11.0–15.0)
Total Lymphocyte: 32.4 %
WBC: 4.9 10*3/uL (ref 3.8–10.8)

## 2020-11-11 LAB — TSH: TSH: 0.82 mIU/L (ref 0.40–4.50)

## 2020-11-11 LAB — VITAMIN B12: Vitamin B-12: 402 pg/mL (ref 200–1100)

## 2020-11-11 LAB — MAGNESIUM: Magnesium: 2.1 mg/dL (ref 1.5–2.5)

## 2020-11-18 ENCOUNTER — Telehealth: Payer: Self-pay

## 2020-11-18 NOTE — Telephone Encounter (Signed)
Prior Auth for Tretinoin 0.1% cream approved through 11/17/21.

## 2020-12-08 ENCOUNTER — Other Ambulatory Visit: Payer: Self-pay | Admitting: Adult Health

## 2020-12-15 IMAGING — MG DIGITAL SCREENING BILAT W/ TOMO
8 series · 8 of 24 positions shown · non-contrast
Comparison: Previous exam(s).

CLINICAL DATA: Screening.

EXAM:
DIGITAL SCREENING BILATERAL MAMMOGRAM WITH TOMO AND CAD

[L CC synth-2D]
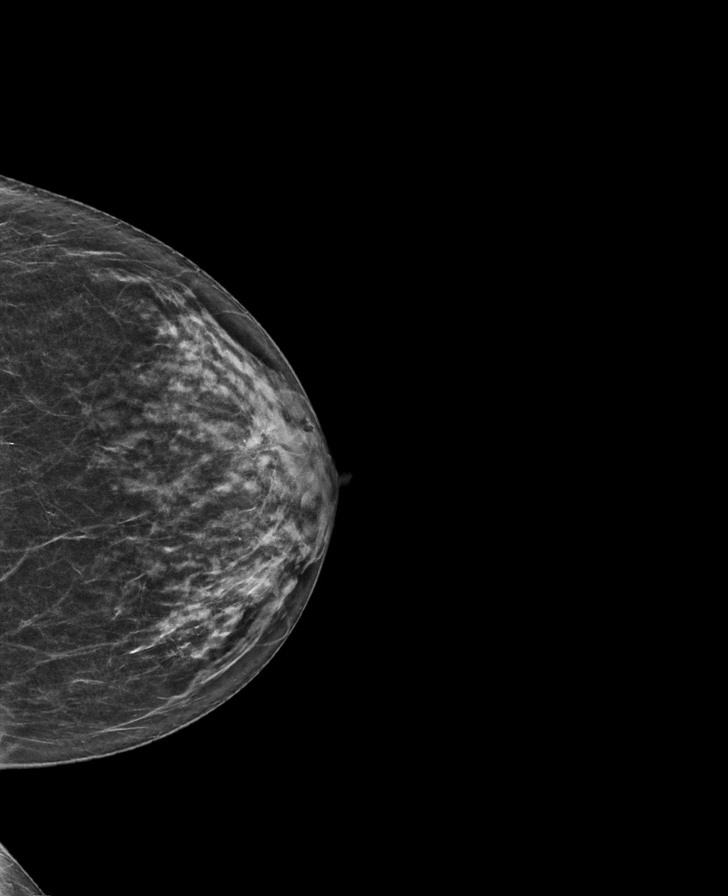

[R CC synth-2D]
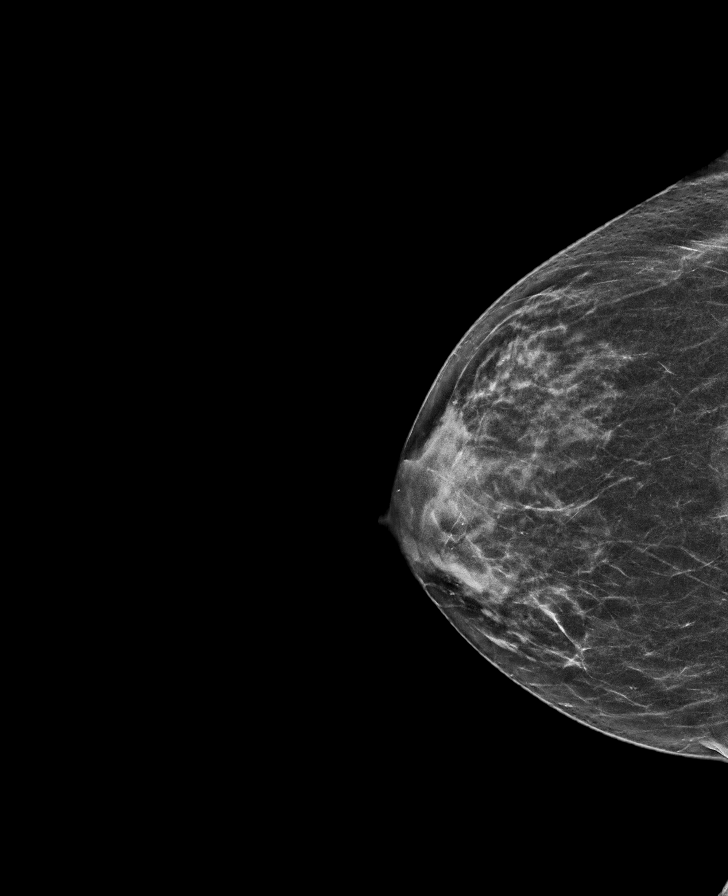

[R MLO synth-2D]
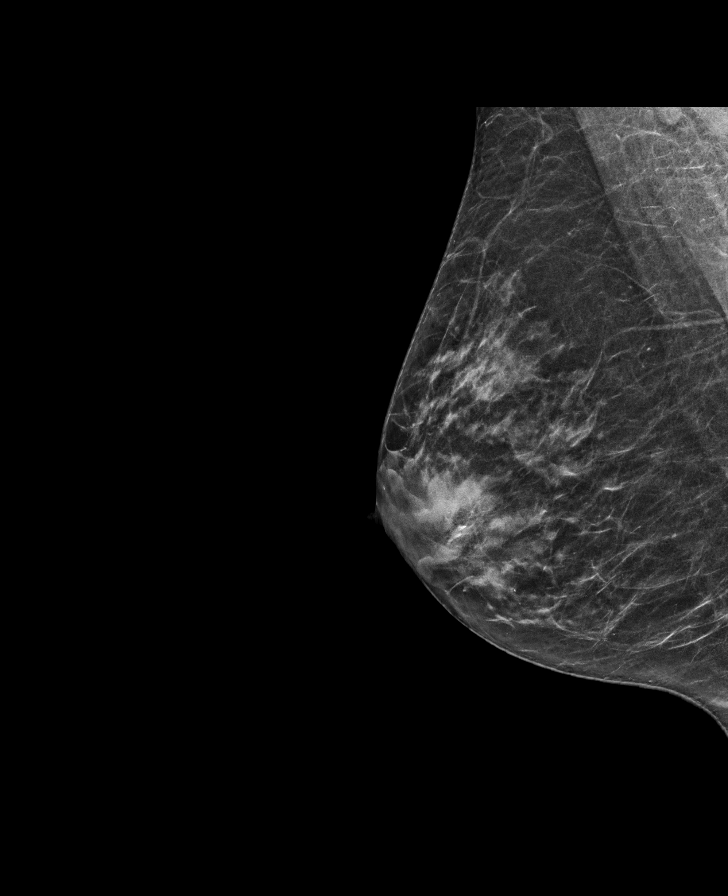

[L MLO synth-2D]
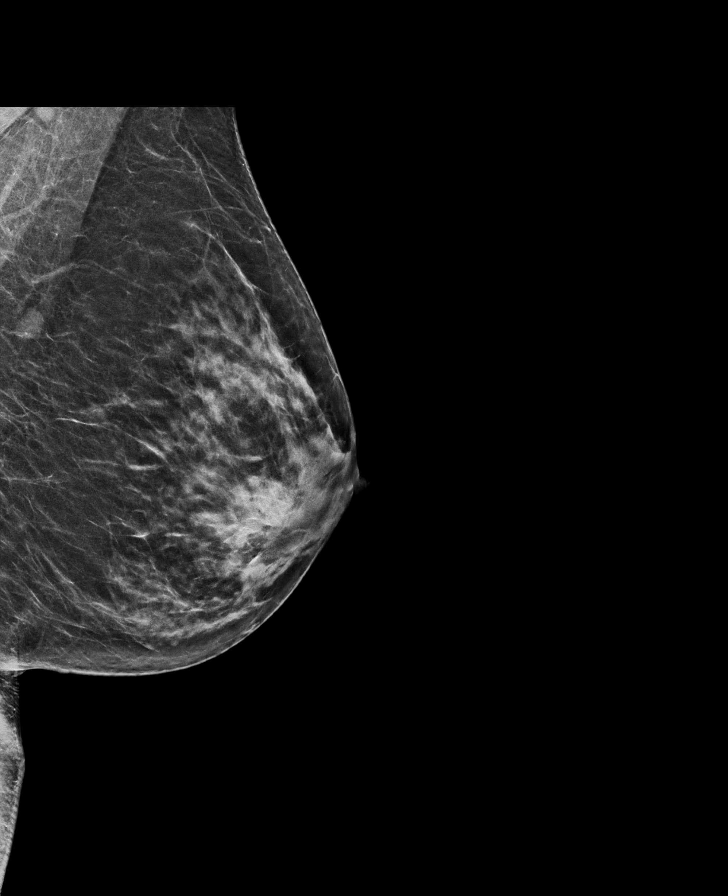

[R MLO tomo · tomo slice 29/57.0]
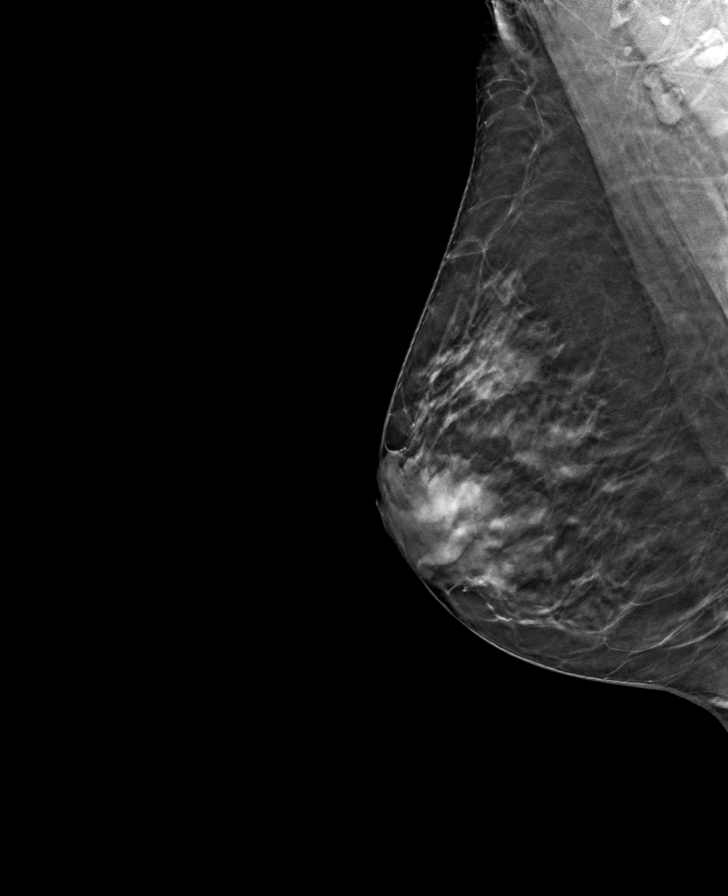

[R CC tomo · tomo slice 31/60.0]
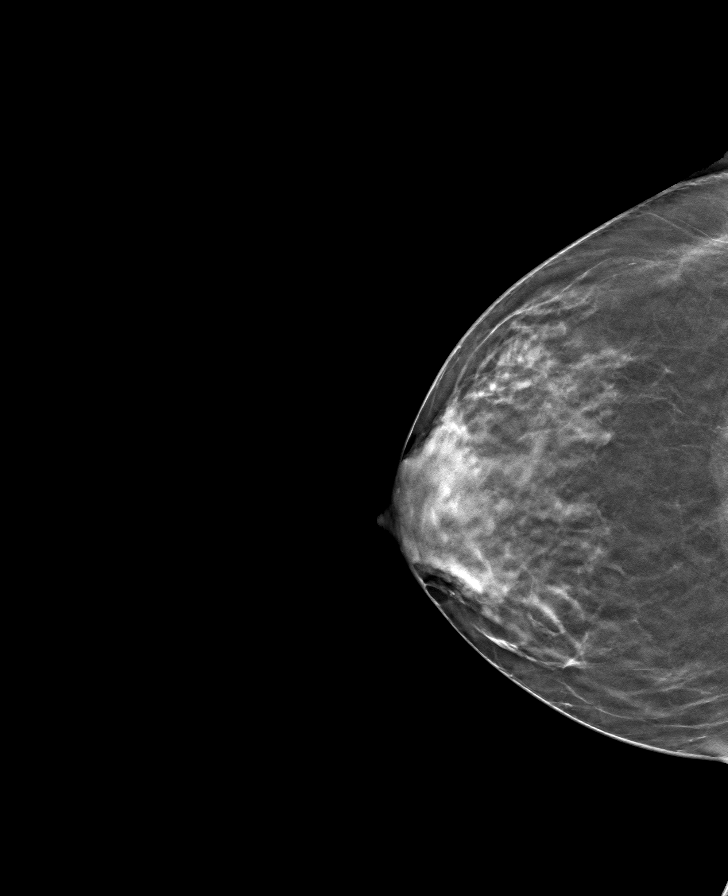

[L MLO tomo · tomo slice 31/60.0]
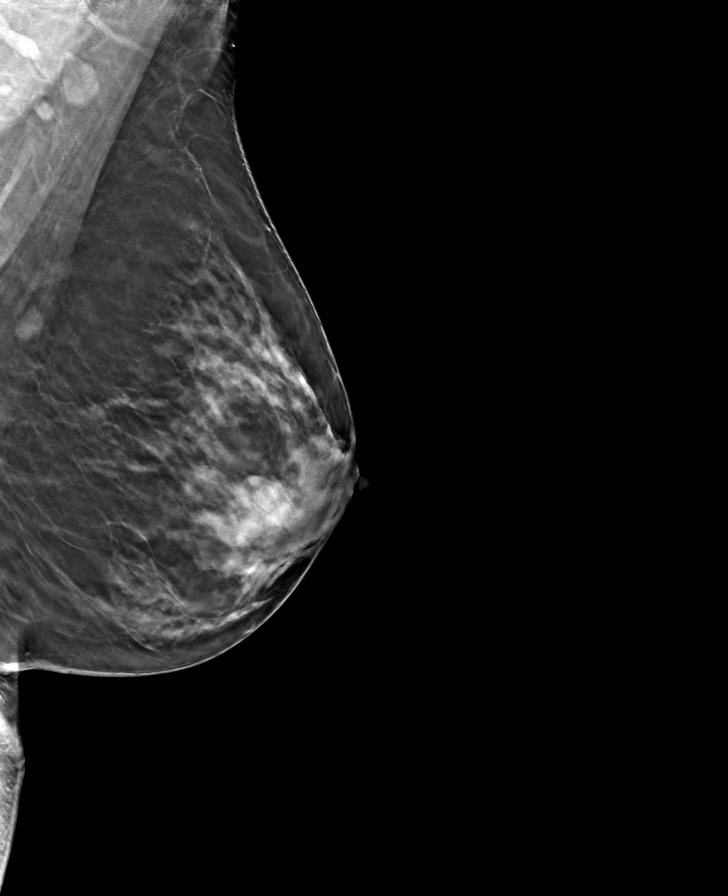

[L CC tomo · tomo slice 31/61.0]
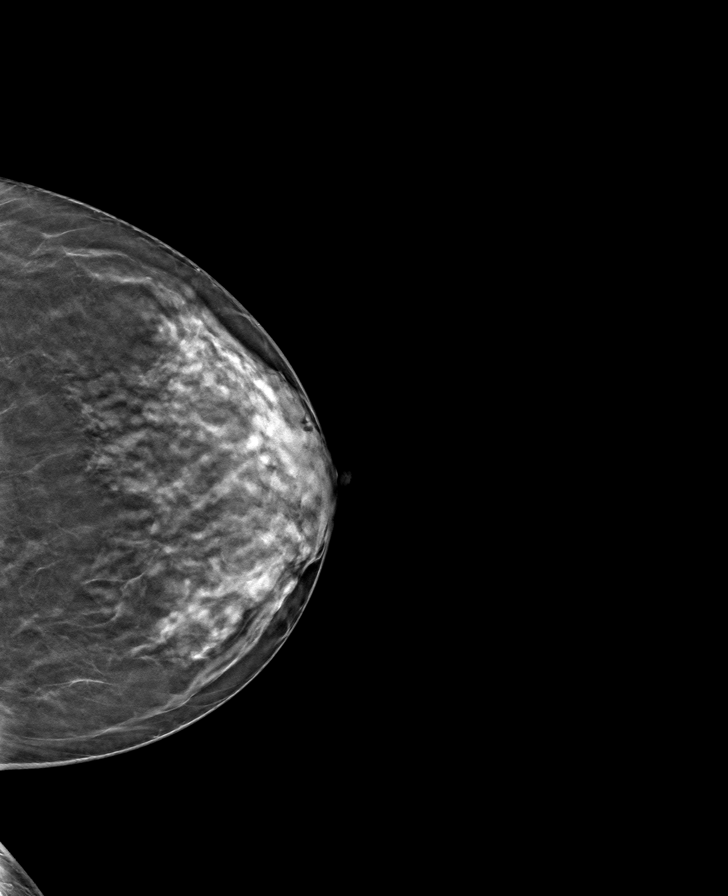

[8 of 24 positions shown; findings below may reference images not displayed]

ACR Breast Density Category c: The breast tissue is heterogeneously
dense, which may obscure small masses.
FINDINGS: There are no findings suspicious for malignancy. Images were
processed with CAD.
IMPRESSION: No mammographic evidence of malignancy. A result letter of this
screening mammogram will be mailed directly to the patient.

RECOMMENDATION:
Screening mammogram in one year. (Code:[id number]-U-LHB)

BI-RADS CATEGORY  1: Negative.

## 2021-05-11 ENCOUNTER — Other Ambulatory Visit: Payer: Self-pay | Admitting: Adult Health

## 2021-05-12 ENCOUNTER — Other Ambulatory Visit: Payer: Self-pay | Admitting: Internal Medicine

## 2021-05-12 DIAGNOSIS — Z1231 Encounter for screening mammogram for malignant neoplasm of breast: Secondary | ICD-10-CM

## 2021-05-13 ENCOUNTER — Encounter: Payer: Managed Care, Other (non HMO) | Admitting: Internal Medicine

## 2021-06-08 ENCOUNTER — Ambulatory Visit
Admission: RE | Admit: 2021-06-08 | Discharge: 2021-06-08 | Disposition: A | Discharge status: Home / Self Care | Payer: Managed Care, Other (non HMO) | Source: Ambulatory Visit | Attending: Internal Medicine | Admitting: Internal Medicine

## 2021-06-08 DIAGNOSIS — Z1231 Encounter for screening mammogram for malignant neoplasm of breast: Secondary | ICD-10-CM

## 2021-06-24 ENCOUNTER — Encounter: Payer: Self-pay | Admitting: Internal Medicine

## 2021-06-24 ENCOUNTER — Other Ambulatory Visit: Payer: Self-pay

## 2021-06-24 ENCOUNTER — Ambulatory Visit (INDEPENDENT_AMBULATORY_CARE_PROVIDER_SITE_OTHER): Payer: Managed Care, Other (non HMO) | Admitting: Internal Medicine

## 2021-06-24 VITALS — BP 126/82 | HR 71 | Temp 97.9°F | Resp 16 | Ht 61.0 in | Wt 127.8 lb

## 2021-06-24 DIAGNOSIS — E559 Vitamin D deficiency, unspecified: Secondary | ICD-10-CM

## 2021-06-24 DIAGNOSIS — Z131 Encounter for screening for diabetes mellitus: Secondary | ICD-10-CM

## 2021-06-24 DIAGNOSIS — Z1389 Encounter for screening for other disorder: Secondary | ICD-10-CM | POA: Diagnosis not present

## 2021-06-24 DIAGNOSIS — R0989 Other specified symptoms and signs involving the circulatory and respiratory systems: Secondary | ICD-10-CM

## 2021-06-24 DIAGNOSIS — R7309 Other abnormal glucose: Secondary | ICD-10-CM

## 2021-06-24 DIAGNOSIS — Z0001 Encounter for general adult medical examination with abnormal findings: Secondary | ICD-10-CM

## 2021-06-24 DIAGNOSIS — R5383 Other fatigue: Secondary | ICD-10-CM

## 2021-06-24 DIAGNOSIS — Z0000 Encounter for general adult medical examination without abnormal findings: Secondary | ICD-10-CM | POA: Diagnosis not present

## 2021-06-24 DIAGNOSIS — Z111 Encounter for screening for respiratory tuberculosis: Secondary | ICD-10-CM | POA: Diagnosis not present

## 2021-06-24 DIAGNOSIS — Z130 Encounter for screening for diseases of the blood and blood-forming organs and certain disorders involving the immune mechanism: Secondary | ICD-10-CM

## 2021-06-24 DIAGNOSIS — Z136 Encounter for screening for cardiovascular disorders: Secondary | ICD-10-CM | POA: Diagnosis not present

## 2021-06-24 DIAGNOSIS — Z13220 Encounter for screening for lipoid disorders: Secondary | ICD-10-CM | POA: Diagnosis not present

## 2021-06-24 DIAGNOSIS — Z1211 Encounter for screening for malignant neoplasm of colon: Secondary | ICD-10-CM

## 2021-06-24 DIAGNOSIS — Z79899 Other long term (current) drug therapy: Secondary | ICD-10-CM

## 2021-06-24 NOTE — Patient Instructions (Signed)
Due to recent changes in healthcare laws, you may see the results of your imaging and laboratory studies on MyChart before your provider has had a chance to review them.  We understand that in some cases there may be results that are confusing or concerning to you. Not all laboratory results come back in the same time frame and the provider may be waiting for multiple results in order to interpret others.  Please give [place] 48 hours in order for your provider to thoroughly review all the results before contacting the office for clarification of your results.   ++++++++++++++++++++++++++++++  Vit D  & Vit C 1,000 mg   are recommended to help protect  against the Covid-19 and other Corona viruses.    Also it's recommended  to take  Zinc 50 mg  to help  protect against the Covid-19   and best place to get  is also on [email or web address]  and don't pay more than 6-8 cents /pill !  ================================ Coronavirus (COVID-19) Are you at risk?  Are you at risk for the Coronavirus (COVID-19)?  To be considered HIGH RISK for Coronavirus (COVID-19), you have to meet the following criteria:  Traveled to [place], [place], [place], [place] or [place]; or in the [place] to [place], [place], [place]  or [place]; and have fever, cough, and shortness of breath within the last 2 weeks of travel OR Been in close contact with a person diagnosed with COVID-19 within the last 2 weeks and have  fever, cough,and shortness of breath  IF YOU DO NOT MEET THESE CRITERIA, YOU ARE CONSIDERED LOW RISK FOR COVID-19.  What to do if you are HIGH RISK for COVID-19?  If you are having a medical emergency, call 911. Seek medical care right away. Before you go to a doctors office, urgent care or emergency department,  call ahead and tell them about your recent travel, contact with someone diagnosed with COVID-19   and your symptoms.  You should receive instructions from your physicians office regarding  next steps of care.  When you arrive at healthcare provider, tell the healthcare staff immediately you have returned from  visiting [place], [place], [place], [place] or [place]; or traveled in the [place] to [place], [place],  [place] or [place] in the last two weeks or you have been in close contact with a person diagnosed with  COVID-19 in the last 2 weeks.   Tell the health care staff about your symptoms: fever, cough and shortness of breath. After you have been seen by a medical provider, you will be either: Tested for (COVID-19) and discharged home on quarantine except to seek medical care if  symptoms worsen, and asked to  Stay home and avoid contact with others until you get your results (4-5 days)  Avoid travel on public transportation if possible (such as bus, train, or airplane) or Sent to the Emergency Department by EMS for evaluation, COVID-19 testing  and  possible admission depending on your condition and test results.  What to do if you are LOW RISK for COVID-19?  Reduce your risk of any infection by using the same precautions used for avoiding the common cold or flu:  Wash your hands often with soap and warm water for at least 20 seconds.  If soap and water are not readily available,  use an alcohol-based hand sanitizer with at least 60% alcohol.  If coughing or sneezing, cover your mouth and nose by  or sneezing into the elbow areas of your shirt or coat,  into a tissue or into your sleeve (not your hands). Avoid shaking hands with others and consider head nods or verbal greetings only. Avoid touching your eyes, nose, or mouth with unwashed hands.  Avoid close contact with people who are sick. Avoid places or events with large numbers of people in one location, like concerts or sporting events. Carefully consider travel plans you have or are making. If you are planning any travel outside or inside the [place], visit the CDC's Travelers' Health webpage for  the latest health notices. If you have some symptoms but not all symptoms, continue to monitor at home and seek medical attention  if your symptoms worsen. If you are having a medical emergency, call 911. >>>>>>>>>>>>>>>>>>>>>>> Preventive Care for Adults  A healthy lifestyle and preventive care can promote health and wellness. Preventive health guidelines for women include the following key practices. A routine yearly physical is a good way to check with your health care provider about your health and preventive screening. It is a chance to share any concerns and updates on your health and to receive a thorough exam. Visit your dentist for a routine exam and preventive care every 6 months. Brush your teeth twice a day and floss once a day. Good oral hygiene prevents tooth decay and gum disease. The frequency of eye exams is based on your age, health, family medical history, use of contact lenses, and other factors. Follow your health care provider's recommendations for frequency of eye exams. Eat a healthy diet. Foods like vegetables, fruits, whole grains, low-fat dairy products, and lean protein foods contain the nutrients you need without too many calories. Decrease your intake of foods high in solid fats, added sugars, and salt. Eat the right amount of calories for you. Get information about a proper diet from your health care provider, if necessary. Regular physical exercise is one of the most important things you can do for your health. Most adults should get at least 150 minutes of moderate-intensity exercise (any activity that increases your heart rate and causes you to sweat) each week. In addition, most adults need muscle-strengthening exercises on 2 or more days a week. Maintain a healthy weight. The body mass index (BMI) is a screening tool to identify possible weight problems. It provides an estimate of body fat based on height and weight. Your health care provider can find your BMI and can  help you achieve or maintain a healthy weight. For adults 20 years and older: A BMI below 18.5 is considered underweight. A BMI of 18.5 to 24.9 is normal. A BMI of 25 to 29.9 is considered overweight. A BMI of 30 and above is considered obese. Maintain normal blood lipids and cholesterol levels by exercising and minimizing your intake of saturated fat. Eat a balanced diet with plenty of fruit and vegetables. Blood tests for lipids and cholesterol should begin at age 20 and be repeated every 5 years. If your lipid or cholesterol levels are high, you are over 50, or you are at high risk for heart disease, you may need your cholesterol levels checked more frequently. Ongoing high lipid and cholesterol levels should be treated with medicines if diet and exercise are not working. If you smoke, find out from your health care provider how to quit. If you do not use tobacco, do not start. Lung cancer screening is recommended for adults aged 55-80 years who are at high risk for developing   developing lung cancer because of a history of smoking. A yearly low-dose CT scan of the lungs is recommended for people who have at least a 30-pack-year history of smoking and are a current smoker or have quit within the past 15 years. A pack year of smoking is smoking an average of 1 pack of cigarettes a day for 1 year (for example: 1 pack a day for 30 years or 2 packs a day for 15 years). Yearly screening should continue until the smoker has stopped smoking for at least 15 years. Yearly screening should be stopped for people who develop a health problem that would prevent them from having lung cancer treatment. High blood pressure causes heart disease and increases the risk of stroke. Your blood pressure should be checked at least every 1 to 2 years. Ongoing high blood pressure should be treated with medicines if weight loss and exercise do not work. If you are 67-36 years old, ask your health care provider if you should take aspirin to  prevent strokes. Diabetes screening involves taking a blood sample to check your fasting blood sugar level. This should be done once every 3 years, after age 84, if you are within normal weight and without risk factors for diabetes. Testing should be considered at a younger age or be carried out more frequently if you are overweight and have at least 1 risk factor for diabetes. Breast cancer screening is essential preventive care for women. You should practice "breast self-awareness." This means understanding the normal appearance and feel of your breasts and may include breast self-examination. Any changes detected, no matter how small, should be reported to a health care provider. Women in their 67s and 30s should have a clinical breast exam (CBE) by a health care provider as part of a regular health exam every 1 to 3 years. After age 53, women should have a CBE every year. Starting at age 68, women should consider having a mammogram (breast X-ray test) every year. Women who have a family history of breast cancer should talk to their health care provider about genetic screening. Women at a high risk of breast cancer should talk to their health care providers about having an MRI and a mammogram every year. Breast cancer gene (BRCA)-related cancer risk assessment is recommended for women who have family members with BRCA-related cancers. BRCA-related cancers include breast, ovarian, tubal, and peritoneal cancers. Having family members with these cancers may be associated with an increased risk for harmful changes (mutations) in the breast cancer genes BRCA1 and BRCA2. Results of the assessment will determine the need for genetic counseling and BRCA1 and BRCA2 testing. Routine pelvic exams to screen for cancer are no longer recommended for nonpregnant women who are considered low risk for cancer of the pelvic organs (ovaries, uterus, and vagina) and who do not have symptoms. Ask your health care provider if a  screening pelvic exam is right for you. If you have had past treatment for cervical cancer or a condition that could lead to cancer, you need Pap tests and screening for cancer for at least 20 years after your treatment. If Pap tests have been discontinued, your risk factors (such as having a new sexual partner) need to be reassessed to determine if screening should be resumed. Some women have medical problems that increase the chance of getting cervical cancer. In these cases, your health care provider may recommend more frequent screening and Pap tests. Colorectal cancer can be detected and often prevented. Most routine  colorectal cancer screening begins at the age of 48 years and continues through age 29 years. However, your health care provider may recommend screening at an earlier age if you have risk factors for colon cancer. On a yearly basis, your health care provider may provide home test kits to check for hidden blood in the stool. Use of a small camera at the end of a tube, to directly examine the colon (sigmoidoscopy or colonoscopy), can detect the earliest forms of colorectal cancer. Talk to your health care provider about this at age 12, when routine screening begins.  Direct exam of the colon should be repeated every 5-10 years through age 26 years, unless early forms of pre-cancerous polyps or small growths are found. Hepatitis C blood testing is recommended for all people born from [birth year] through 1965 and any individual with known risks for hepatitis C.  Osteoporosis is a disease in which the bones lose minerals and strength with aging. This can result in serious bone fractures or breaks. The risk of osteoporosis can be identified using a bone density scan. Women ages 30 years and over and women at risk for fractures or osteoporosis should discuss screening with their health care providers. Ask your health care provider whether you should take a calcium supplement or vitamin D to reduce the rate  of osteoporosis. Menopause can be associated with physical symptoms and risks. Hormone replacement therapy is available to decrease symptoms and risks. You should talk to your health care provider about whether hormone replacement therapy is right for you. Use sunscreen. Apply sunscreen liberally and repeatedly throughout the day. You should seek shade when your shadow is shorter than you. Protect yourself by wearing long sleeves, pants, a wide-brimmed hat, and sunglasses year round, whenever you are outdoors. Once a month, do a whole body skin exam, using a mirror to look at the skin on your back. Tell your health care provider of new moles, moles that have irregular borders, moles that are larger than a pencil eraser, or moles that have changed in shape or color. Stay current with required vaccines (immunizations). Influenza vaccine. All adults should be immunized every year. Tetanus, diphtheria, and acellular pertussis (Td, Tdap) vaccine. Pregnant women should receive 1 dose of Tdap vaccine during each pregnancy. The dose should be obtained regardless of the length of time since the last dose. Immunization is preferred during the 27th-36th week of gestation. An adult who has not previously received Tdap or who does not know her vaccine status should receive 1 dose of Tdap. This initial dose should be followed by tetanus and diphtheria toxoids (Td) booster doses every 10 years. Adults with an unknown or incomplete history of completing a 3-dose immunization series with Td-containing vaccines should begin or complete a primary immunization series including a Tdap dose. Adults should receive a Td booster every 10 years. Varicella vaccine. An adult without evidence of immunity to varicella should receive 2 doses or a second dose if she has previously received 1 dose. Pregnant females who do not have evidence of immunity should receive the first dose after pregnancy. This first dose should be obtained before  leaving the health care facility. The second dose should be obtained 4-8 weeks after the first dose. Human papillomavirus (HPV) vaccine. Females aged 13-26 years who have not received the vaccine previously should obtain the 3-dose series. The vaccine is not recommended for use in pregnant females. However, pregnancy testing is not needed before receiving a dose. If a female is found  be pregnant after receiving a dose, no treatment is needed. In that case, the remaining doses should be delayed until after the pregnancy. Immunization is recommended for any person with an immunocompromised condition through the age of 26 years if she did not get any or all doses earlier. During the 3-dose series, the second dose should be obtained 4-8 weeks after the first dose. The third dose should be obtained 24 weeks after the first dose and 16 weeks after the second dose. Zoster vaccine. One dose is recommended for adults aged 60 years or older unless certain conditions are present. Measles, mumps, and rubella (MMR) vaccine. Adults born before 1957 generally are considered immune to measles and mumps. Adults born in 1957 or later should have 1 or more doses of MMR vaccine unless there is a contraindication to the vaccine or there is laboratory evidence of immunity to each of the three diseases. A routine second dose of MMR vaccine should be obtained at least 28 days after the first dose for students attending postsecondary schools, health care workers, or international travelers. People who received inactivated measles vaccine or an unknown type of measles vaccine during 1963-1967 should receive 2 doses of MMR vaccine. People who received inactivated mumps vaccine or an unknown type of mumps vaccine before 1979 and are at high risk for mumps infection should consider immunization with 2 doses of MMR vaccine. For females of childbearing age, rubella immunity should be determined. If there is no evidence of immunity, females  who are not pregnant should be vaccinated. If there is no evidence of immunity, females who are pregnant should delay immunization until after pregnancy. Unvaccinated health care workers born before 1957 who lack laboratory evidence of measles, mumps, or rubella immunity or laboratory confirmation of disease should consider measles and mumps immunization with 2 doses of MMR vaccine or rubella immunization with 1 dose of MMR vaccine. Pneumococcal 13-valent conjugate (PCV13) vaccine. When indicated, a person who is uncertain of her immunization history and has no record of immunization should receive the PCV13 vaccine. An adult aged 19 years or older who has certain medical conditions and has not been previously immunized should receive 1 dose of PCV13 vaccine. This PCV13 should be followed with a dose of pneumococcal polysaccharide (PPSV23) vaccine. The PPSV23 vaccine dose should be obtained at least 1 or more year(s) after the dose of PCV13 vaccine. An adult aged 19 years or older who has certain medical conditions and previously received 1 or more doses of PPSV23 vaccine should receive 1 dose of PCV13. The PCV13 vaccine dose should be obtained 1 or more years after the last PPSV23 vaccine dose.  Pneumococcal polysaccharide (PPSV23) vaccine. When PCV13 is also indicated, PCV13 should be obtained first. All adults aged 65 years and older should be immunized. An adult younger than age 65 years who has certain medical conditions should be immunized. Any person who resides in a nursing home or long-term care facility should be immunized. An adult smoker should be immunized. People with an immunocompromised condition and certain other conditions should receive both PCV13 and PPSV23 vaccines. People with human immunodeficiency virus (HIV) infection should be immunized as soon as possible after diagnosis. Immunization during chemotherapy or radiation therapy should be avoided. Routine use of PPSV23 vaccine is not  recommended for American Indians, [place] Natives, or people younger than 65 years unless there are medical conditions that require PPSV23 vaccine. When indicated, people who have unknown immunization and have no record of immunization should receive   PPSV23 vaccine. One-time revaccination 5 years after the first dose of PPSV23 is recommended for people aged 19-64 years who have chronic kidney failure, nephrotic syndrome, asplenia, or immunocompromised conditions. People who received 1-2 doses of PPSV23 before age 65 years should receive another dose of PPSV23 vaccine at age 65 years or later if at least 5 years have passed since the previous dose. Doses of PPSV23 are not needed for people immunized with PPSV23 at or after age 65 years.  Preventive Services / Frequency  Ages 40 to 64 years Blood pressure check. Lipid and cholesterol check. Lung cancer screening. / Every year if you are aged 55-80 years and have a 30-pack-year history of smoking and currently smoke or have quit within the past 15 years. Yearly screening is stopped once you have quit smoking for at least 15 years or develop a health problem that would prevent you from having lung cancer treatment. Clinical breast exam.** / Every year after age 40 years.  BRCA-related cancer risk assessment.** / For women who have family members with a BRCA-related cancer (breast, ovarian, tubal, or peritoneal cancers). Mammogram.** / Every year beginning at age 40 years and continuing for as long as you are in good health. Consult with your health care provider. Pap test.** / Every 3 years starting at age 30 years through age 65 or 70 years with a history of 3 consecutive normal Pap tests. HPV screening.** / Every 3 years from ages 30 years through ages 65 to 70 years with a history of 3 consecutive normal Pap tests. Fecal occult blood test (FOBT) of stool. / Every year beginning at age 50 years and continuing until age 75 years. You may not need to do this  test if you get a colonoscopy every 10 years. Flexible sigmoidoscopy or colonoscopy.** / Every 5 years for a flexible sigmoidoscopy or every 10 years for a colonoscopy beginning at age 50 years and continuing until age 75 years. Hepatitis C blood test.** / For all people born from 1945 through 1965 and any individual with known risks for hepatitis C. Skin self-exam. / Monthly. Influenza vaccine. / Every year. Tetanus, diphtheria, and acellular pertussis (Tdap/Td) vaccine.** / Consult your health care provider. Pregnant women should receive 1 dose of Tdap vaccine during each pregnancy. 1 dose of Td every 10 years. Varicella vaccine.** / Consult your health care provider. Pregnant females who do not have evidence of immunity should receive the first dose after pregnancy. Zoster vaccine.** / 1 dose for adults aged 60 years or older. Pneumococcal 13-valent conjugate (PCV13) vaccine.** / Consult your health care provider. Pneumococcal polysaccharide (PPSV23) vaccine.** / 1 to 2 doses if you smoke cigarettes or if you have certain conditions. Meningococcal vaccine.** / Consult your health care provider. Hepatitis A vaccine.** / Consult your health care provider. Hepatitis B vaccine.** / Consult your health care provider. Screening for abdominal aortic aneurysm (AAA)  by ultrasound is recommended for people over 50 who have history of high blood pressure or who are current or former smokers. ++++++++++++++++++ Recommend Adult Low Dose Aspirin or  coated  Aspirin 81 mg daily  To reduce risk of Colon Cancer 40 %,  Skin Cancer 26 % ,  Melanoma 46%  and  Pancreatic cancer 60% +++++++++++++++++++ Vitamin D goal  is between 70-100.  Please make sure that you are taking your Vitamin D as directed.  It is very important as a natural anti-inflammatory  helping hair, skin, and nails, as well as reducing stroke and heart   attack risk.  It helps your bones and helps with mood. It also decreases numerous  cancer risks so please take it as directed.  Low Vit D is associated with a 200-300% higher risk for CANCER  and 200-300% higher risk for HEART   ATTACK  &  STROKE.   ...................................... It is also associated with higher death rate at younger ages,  autoimmune diseases like Rheumatoid arthritis, Lupus, Multiple Sclerosis.    Also many other serious conditions, like depression, Alzheimer's Dementia, infertility, muscle aches, fatigue, fibromyalgia - just to name a few. ++++++++++++++++++ Recommend the book "The END of DIETING" by Dr Joel Fuhrman  & the book "The END of DIABETES " by Dr Joel Fuhrman At [employer] - get book & Audio CD's    Being diabetic has a  300% increased risk for heart attack, stroke, cancer, and alzheimer- type vascular dementia. It is very important that you work harder with diet by avoiding all foods that are white. Avoid white rice (brown & wild rice is OK), white potatoes (sweetpotatoes in moderation is OK), White bread or wheat bread or anything made out of white flour like bagels, donuts, rolls, buns, biscuits, cakes, pastries, cookies, pizza crust, and pasta (made from white flour & egg whites) - vegetarian pasta or spinach or wheat pasta is OK. Multigrain breads like Arnold's or [employer], or multigrain sandwich thins or flatbreads.  Diet, exercise and weight loss can reverse and cure diabetes in the early stages.  Diet, exercise and weight loss is very important in the control and prevention of complications of diabetes which affects every system in your body, ie. Brain - dementia/stroke, eyes - glaucoma/blindness, heart - heart attack/heart failure, kidneys - dialysis, stomach - gastric paralysis, intestines - malabsorption, nerves - severe painful neuritis, circulation - gangrene & loss of a leg(s), and finally cancer and Alzheimers.    I recommend avoid fried & greasy foods,  sweets/candy, white rice (brown or wild rice or Quinoa is OK), white  potatoes (sweet potatoes are OK) - anything made from white flour - bagels, doughnuts, rolls, buns, biscuits,white and wheat breads, pizza crust and traditional pasta made of white flour & egg white(vegetarian pasta or spinach or wheat pasta is OK).  Multi-grain bread is OK - like multi-grain flat bread or sandwich thins. Avoid alcohol in excess. Exercise is also important.    Eat all the vegetables you want - avoid meat, especially red meat and dairy - especially cheese.  Cheese is the most concentrated form of trans-fats which is the worst thing to clog up our arteries. Veggie cheese is OK which can be found in the fresh produce section at [employer] or [employer] or [employer]  ++++++++++++++++++++++ DASH Eating Plan  DASH stands for "Dietary Approaches to Stop Hypertension."   The DASH eating plan is a healthy eating plan that has been shown to reduce high blood pressure (hypertension). Additional health benefits may include reducing the risk of type 2 diabetes mellitus, heart disease, and stroke. The DASH eating plan may also help with weight loss. WHAT DO I NEED TO KNOW ABOUT THE DASH EATING PLAN? For the DASH eating plan, you will follow these general guidelines: Choose foods with a percent daily value for sodium of less than 5% (as listed on the food label). Use salt-free seasonings or herbs instead of table salt or sea salt. Check with your health care provider or pharmacist before using salt substitutes. Eat lower-sodium products, often labeled as "lower sodium" or "no   salt added." Eat fresh foods. Eat more vegetables, fruits, and low-fat dairy products. Choose whole grains. Look for the word "whole" as the first word in the ingredient list. Choose fish  Limit sweets, desserts, sugars, and sugary drinks. Choose heart-healthy fats. Eat veggie cheese  Eat more home-cooked food and less restaurant, buffet, and fast food. Limit fried foods. Cook foods using methods other than  frying. Limit canned vegetables. If you do use them, rinse them well to decrease the sodium. When eating at a restaurant, ask that your food be prepared with less salt, or no salt if possible.                      WHAT FOODS CAN I EAT? Read Dr Joel Fuhrman's books on The End of Dieting & The End of Diabetes  Grains Whole grain or whole wheat bread. Brown rice. Whole grain or whole wheat pasta. Quinoa, bulgur, and whole grain cereals. Low-sodium cereals. Corn or whole wheat flour tortillas. Whole grain cornbread. Whole grain crackers. Low-sodium crackers.  Vegetables Fresh or frozen vegetables (raw, steamed, roasted, or grilled). Low-sodium or reduced-sodium tomato and vegetable juices. Low-sodium or reduced-sodium tomato sauce and paste. Low-sodium or reduced-sodium canned vegetables.   Fruits All fresh, canned (in natural juice), or frozen fruits.  Protein Products  All fish and seafood.  Dried beans, peas, or lentils. Unsalted nuts and seeds. Unsalted canned beans.  Dairy Low-fat dairy products, such as skim or 1% milk, 2% or reduced-fat cheeses, low-fat ricotta or cottage cheese, or plain low-fat yogurt. Low-sodium or reduced-sodium cheeses.  Fats and Oils Tub margarines without trans fats. Light or reduced-fat mayonnaise and salad dressings (reduced sodium). Avocado. Safflower, olive, or canola oils. Natural peanut or almond butter.  Other Unsalted popcorn and pretzels. The items listed above may not be a complete list of recommended foods or beverages. Contact your dietitian for more options.  ++++++++++++++++++  WHAT FOODS ARE NOT RECOMMENDED? Grains/ White flour or wheat flour White bread. White pasta. White rice. Refined cornbread. Bagels and croissants. Crackers that contain trans fat.  Vegetables  Creamed or fried vegetables. Vegetables in a . Regular canned vegetables. Regular canned tomato sauce and paste. Regular tomato and vegetable juices.  Fruits Dried fruits.  Canned fruit in light or heavy syrup. Fruit juice.  Meat and Other Protein Products Meat in general - RED meat & White meat.  Fatty cuts of meat. Ribs, chicken wings, all processed meats as bacon, sausage, bologna, salami, fatback, hot dogs, bratwurst and packaged luncheon meats.  Dairy Whole or 2% milk, cream, half-and-half, and cream cheese. Whole-fat or sweetened yogurt. Full-fat cheeses or blue cheese. Non-dairy creamers and whipped toppings. Processed cheese, cheese spreads, or cheese curds.  Condiments Onion and garlic salt, seasoned salt, table salt, and sea salt. Canned and packaged gravies. Worcestershire sauce. Tartar sauce. Barbecue sauce. Teriyaki sauce. Soy sauce, including reduced sodium. Steak sauce. Fish sauce. Oyster sauce. Cocktail sauce. Horseradish. Ketchup and mustard. Meat flavorings and tenderizers. Bouillon cubes. Hot sauce. Tabasco sauce. Marinades. Taco seasonings. Relishes.  Fats and Oils Butter, stick margarine, lard, shortening and bacon fat. Coconut, palm kernel, or palm oils. Regular salad dressings.  Pickles and olives. Salted popcorn and pretzels.  The items listed above may not be a complete list of foods and beverages to avoid.   

## 2021-06-24 NOTE — Progress Notes (Signed)
Annual Screening/Preventative Visit & Comprehensive Evaluation &  Examination  Future Appointments  Date Time Provider Department  06/24/2021  2:00 PM Unk Pinto, MD [facility]  06/29/2022  2:00 PM Unk Pinto, MD [facility]        This very nice 61 y.o. [facility] presents for a Screening /Preventative Visit & comprehensive evaluation and management of multiple medical co-morbidities.  Patient has been followed for HTN, HLD, Prediabetes  and Vitamin D Deficiency.        Patient is followed expectantly for labile hypertension.   Patient's BP has been controlled at home and patient denies any cardiac symptoms as chest pain, palpitations, shortness of breath, dizziness or ankle swelling. Today's BP is at goal - 126/82.         Patient's hyperlipidemia is controlled with diet and medications. Patient denies myalgias or other medication SE's. Last lipids were at goal :  Lab Results  Component Value Date   CHOL 186 04/28/2020   HDL 77 04/28/2020   LDLCALC 96 04/28/2020   TRIG 51 04/28/2020   CHOLHDL 2.4 04/28/2020         Patient has been monitpored expectantly for glucose intolerance  and patient denies reactive hypoglycemic symptoms, visual blurring, diabetic polys or paresthesias. Last A1c was normal & at goal :  Lab Results  Component Value Date   HGBA1C 4.9 04/28/2020         Finally, patient has history of Vitamin D Deficiency and last Vitamin D was at goal : Lab Results  Component Value Date   VD25OH 83 04/28/2020     Current Outpatient Medications on File Prior to Visit  Medication Sig   VITAMIN C 1000 MG tablet Take daily.   buPROPion -XL  300 MG  Take  1 tablet  Daily  for Mood, Focus & Concentration   CITRACAL  Take daily.   DIVIGEL 1 MG/GM GEL Apply daily   ipratropium 0.06 % nasal spray USE 1 TO 2 SPRAYS EACH NOSTRIL 3 TO 4 TIMES A DAY   levocetirizine  5 MG tablet Take  1 tablet  Daily  for Allergies   Multiple Vitamins-Minerals  Take daily.    D-Mannose 500 mg  daily   Quinol Immune Support 1 Takes  daily   ALIGN  Take daily.   progesterone  100 MG capsule Take    Pseudoephedrine 120 MG 12 hr  Take  1 tablet 2 x /day (every 12 hours)      Quercetin 50 MG TABS Take by mouth daily.   tretinoin (RETIN-A) 0.1 % cream Apply topically at bedtime. Apply at bedtime topically.   TURMERIC CURCUMIN  Take  daily.   Vit D-Vit K (VIT K2-VIT D3 ) Take  daily.     Allergies  Allergen Reactions   Dtap-Ipv Vaccine Hives   Flexeril [Cyclobenzaprine]      Past Medical History:  Diagnosis Date   GERD (gastroesophageal reflux disease)    Hypertension    Palpitations    Poor sleep hygiene      Health Maintenance  Topic Date Due   Zoster Vaccines- Shingrix (1 of 2) Never done   PAP SMEAR-Modifier  Never done   COVID-19 Vaccine (3 - Pfizer risk series) 10/01/2019   INFLUENZA VACCINE  11/17/2020   MAMMOGRAM  06/08/2022   Fecal DNA (Cologuard)  07/30/2023   Hepatitis C Screening  Completed   HIV Screening  Completed   HPV VACCINES  Aged Out   TETANUS/TDAP  Discontinued  Immunization History  Administered Date(s) Administered   Influenza 02/17/2018   PFIZER SARS-COV-2 Vacc 08/03/2019, 09/03/2019   PPD Test 03/08/2018, 03/29/2019, 04/28/2020   Pneumococcal -23 10/29/2008     Cologard - Negative 07/18/2017 -  Cologard - Negative 07/28/2020 - recc 3 year  f/u due Apr 2025  Last MGM - 06/08/2021   Past Surgical History:  Procedure Laterality Date   SQUINT REPAIR Right 1974     Family History  Problem Relation Age of Onset   Cancer Mother    COPD Father      Social History   Tobacco Use   Smoking status: Never   Smokeless tobacco: Never  Substance Use Topics   Alcohol use: Yes    Alcohol/week: 10.0 standard drinks    Types: 10 Standard drinks or equivalent per week    Comment: wine   Drug use: No      ROS Constitutional: Denies fever, chills, weight loss/gain, headaches, insomnia,  night sweats, and  change in appetite. Does c/o fatigue. Eyes: Denies redness, blurred vision, diplopia, discharge, itchy, watery eyes.  ENT: Denies discharge, congestion, post nasal drip, epistaxis, sore throat, earache, hearing loss, dental pain, Tinnitus, Vertigo, Sinus pain, snoring.  Cardio: Denies chest pain, palpitations, irregular heartbeat, syncope, dyspnea, diaphoresis, orthopnea, PND, claudication, edema Respiratory: denies cough, dyspnea, DOE, pleurisy, hoarseness, laryngitis, wheezing.  Gastrointestinal: Denies dysphagia, heartburn, reflux, water brash, pain, cramps, nausea, vomiting, bloating, diarrhea, constipation, hematemesis, melena, hematochezia, jaundice, hemorrhoids Genitourinary: Denies dysuria, frequency, urgency, nocturia, hesitancy, discharge, hematuria, flank pain Breast: Breast lumps, nipple discharge, bleeding.  Musculoskeletal: Denies arthralgia, myalgia, stiffness, Jt. Swelling, pain, limp, and strain/sprain. Denies falls. Skin: Denies puritis, rash, hives, warts, acne, eczema, changing in skin lesion Neuro: No weakness, tremor, incoordination, spasms, paresthesia, pain Psychiatric: Denies confusion, memory loss, sensory loss. Denies Depression. Endocrine: Denies change in weight, skin, hair change, nocturia, and paresthesia, diabetic polys, visual blurring, hyper / hypo glycemic episodes.  Heme/Lymph: No excessive bleeding, bruising, enlarged lymph nodes.  Physical Exam  BP 126/82    Pulse 71    Temp 97.9 F (36.6 C)    Resp 16    Ht '5\' 1"'$  (1.549 m)    Wt 127 lb 12.8 oz (58 kg)    SpO2 99%    BMI 24.15 kg/m   General Appearance: Well nourished, well groomed and in no apparent distress.  Eyes: PERRLA, EOMs, conjunctiva no swelling or erythema, normal fundi and vessels. Sinuses: No frontal/maxillary tenderness ENT/Mouth: EACs patent / TMs  nl. Nares clear without erythema, swelling, mucoid exudates. Oral hygiene is good. No erythema, swelling, or exudate. Tongue normal,  non-obstructing. Tonsils not swollen or erythematous. Hearing normal.  Neck: Supple, thyroid not palpable. No bruits, nodes or JVD. Respiratory: Respiratory effort normal.  BS equal and clear bilateral without rales, rhonci, wheezing or stridor. Cardio: Heart sounds are normal with regular rate and rhythm and no murmurs, rubs or gallops. Peripheral pulses are normal and equal bilaterally without edema. No aortic or femoral bruits. Chest: symmetric with normal excursions and percussion. Breasts: deferred to Gyn Abdomen: Flat, soft with bowel sounds active. Nontender, no guarding, rebound, hernias, masses, or organomegaly.  Lymphatics: Non tender without lymphadenopathy.  Musculoskeletal: Full ROM all peripheral extremities, joint stability, 5/5 strength, and normal gait. Skin: Warm and dry without rashes, lesions, cyanosis, clubbing or  ecchymosis.  Neuro: Cranial nerves intact, reflexes equal bilaterally. Normal muscle tone, no cerebellar symptoms. Sensation intact.  Pysch: Alert and oriented X 3, normal affect, Insight and Judgment  appropriate.    Assessment and Plan  1. Annual Preventative Screening Examination   2. Labile hypertension  - EKG 12-Lead - [place], RETROPERITNL ABD,  LTD - CBC with Differential/Platelet - COMPLETE METABOLIC PANEL WITH GFR - Magnesium - TSH  3. Screening cholesterol level  - EKG 12-Lead - [place], RETROPERITNL ABD,  LTD - Lipid panel - TSH  4. Abnormal glucose  - EKG 12-Lead - [place], RETROPERITNL ABD,  LTD - Hemoglobin A1c - Insulin, random  5. Vitamin D deficiency  - VITAMIN D 25 Hydroxy   6. Screening for colorectal cancer  - POC Hemoccult Bld/Stl   7. Screening for tuberculosis  - TB Skin Test  8. Screening for ischemic heart disease  - EKG 12-Lead  9. Screening for AAA (aortic abdominal aneurysm)  - [place], RETROPERITNL ABD,  LTD  10. Fatigue, unspecified type  - Iron, Total/Total Iron Binding Cap - Vitamin B12 - CBC with  Differential/Platelet - TSH  11. Medication management  - Urinalysis, Routine w reflex microscopic - Microalbumin / creatinine urine ratio - CBC with Differential/Platelet - COMPLETE METABOLIC PANEL WITH GFR - Magnesium - Lipid panel - TSH - Hemoglobin A1c - Insulin, random - VITAMIN D 25 Hydroxy          Patient was counseled in prudent diet to achieve/maintain BMI less than 25 for weight control, BP monitoring, regular exercise and medications. Discussed med's effects and SE's. Screening labs and tests as requested with regular follow-up as recommended. Over 40 minutes of exam, counseling, chart review and high complex critical decision making was performed.   Kirtland Bouchard, MD

## 2021-06-25 LAB — CBC WITH DIFFERENTIAL/PLATELET
Absolute Monocytes: 630 cells/uL (ref 200–950)
Basophils Absolute: 42 cells/uL (ref 0–200)
Basophils Relative: 0.6 %
Eosinophils Absolute: 49 cells/uL (ref 15–500)
Eosinophils Relative: 0.7 %
HCT: 43.3 % (ref 35.0–45.0)
Hemoglobin: 14.3 g/dL (ref 11.7–15.5)
Lymphs Abs: 1498 cells/uL (ref 850–3900)
MCH: 30.7 pg (ref 27.0–33.0)
MCHC: 33 g/dL (ref 32.0–36.0)
MCV: 92.9 fL (ref 80.0–100.0)
MPV: 10.1 fL (ref 7.5–12.5)
Monocytes Relative: 9 %
Neutro Abs: 4781 cells/uL (ref 1500–7800)
Neutrophils Relative %: 68.3 %
Platelets: 257 10*3/uL (ref 140–400)
RBC: 4.66 10*6/uL (ref 3.80–5.10)
RDW: 12.4 % (ref 11.0–15.0)
Total Lymphocyte: 21.4 %
WBC: 7 10*3/uL (ref 3.8–10.8)

## 2021-06-25 LAB — LIPID PANEL
Cholesterol: 206 mg/dL — ABNORMAL HIGH (ref <200)
HDL: 86 mg/dL (ref >=50)
LDL Cholesterol (Calc): 101 mg/dL (calc) — ABNORMAL HIGH
Non-HDL Cholesterol (Calc): 120 mg/dL (calc) (ref <130)
Total CHOL/HDL Ratio: 2.4 (calc) (ref <5.0)
Triglycerides: 97 mg/dL (ref <150)

## 2021-06-25 LAB — COMPLETE METABOLIC PANEL WITH GFR
AG Ratio: 1.9 (calc) (ref 1.0–2.5)
ALT: 14 U/L (ref 6–29)
AST: 23 U/L (ref 10–35)
Albumin: 4.7 g/dL (ref 3.6–5.1)
Alkaline phosphatase (APISO): 53 U/L (ref 37–153)
BUN: 10 mg/dL (ref 7–25)
CO2: 26 mmol/L (ref 20–32)
Calcium: 9.6 mg/dL (ref 8.6–10.4)
Chloride: 104 mmol/L (ref 98–110)
Creat: 0.84 mg/dL (ref 0.50–1.03)
Globulin: 2.5 g/dL (calc) (ref 1.9–3.7)
Glucose, Bld: 73 mg/dL (ref 65–99)
Potassium: 4.4 mmol/L (ref 3.5–5.3)
Sodium: 141 mmol/L (ref 135–146)
Total Bilirubin: 0.5 mg/dL (ref 0.2–1.2)
Total Protein: 7.2 g/dL (ref 6.1–8.1)
eGFR: 80 mL/min/{1.73_m2} (ref >=60)

## 2021-06-25 LAB — URINALYSIS, ROUTINE W REFLEX MICROSCOPIC
Bilirubin Urine: NEGATIVE
Glucose, UA: NEGATIVE
Hgb urine dipstick: NEGATIVE
Hyaline Cast: NONE SEEN /LPF
Ketones, ur: NEGATIVE
Nitrite: NEGATIVE
Protein, ur: NEGATIVE
RBC / HPF: NONE SEEN /HPF (ref 0–2)
Specific Gravity, Urine: 1.007 (ref 1.001–1.035)
WBC, UA: NONE SEEN /HPF (ref 0–5)
pH: 7 (ref 5.0–8.0)

## 2021-06-25 LAB — INSULIN, RANDOM: Insulin: 3.6 u[IU]/mL

## 2021-06-25 LAB — HEMOGLOBIN A1C
Hgb A1c MFr Bld: 4.9 % of total Hgb (ref <5.7)
Mean Plasma Glucose: 94 mg/dL
eAG (mmol/L): 5.2 mmol/L

## 2021-06-25 LAB — VITAMIN D 25 HYDROXY (VIT D DEFICIENCY, FRACTURES): Vit D, 25-Hydroxy: 91 ng/mL (ref 30–100)

## 2021-06-25 LAB — MICROALBUMIN / CREATININE URINE RATIO
Creatinine, Urine: 25 mg/dL (ref 20–275)
Microalb, Ur: <0.2 mg/dL

## 2021-06-25 LAB — IRON, TOTAL/TOTAL IRON BINDING CAP
%SAT: 35 % (calc) (ref 16–45)
Iron: 121 ug/dL (ref 45–160)
TIBC: 344 mcg/dL (calc) (ref 250–450)

## 2021-06-25 LAB — MAGNESIUM: Magnesium: 2.2 mg/dL (ref 1.5–2.5)

## 2021-06-25 LAB — MICROSCOPIC MESSAGE

## 2021-06-25 LAB — VITAMIN B12: Vitamin B-12: 476 pg/mL (ref 200–1100)

## 2021-06-25 LAB — TSH: TSH: 2.42 mIU/L (ref 0.40–4.50)

## 2021-06-25 MED ORDER — TOPIRAMATE 50 MG PO TABS
ORAL_TABLET | ORAL | 1 refills | Status: DC
Start: 2021-06-25 — End: 2022-06-29

## 2021-06-25 MED ORDER — PHENTERMINE HCL 37.5 MG PO TABS: ORAL_TABLET | ORAL | 1 refills | Status: DC

## 2021-06-25 NOTE — Addendum Note (Signed)
Addended by: Unk Pinto on: 06/25/2021 06:32 PM ? ? Modules accepted: Orders ? ?

## 2021-06-25 NOTE — Progress Notes (Signed)
<><><><><><><><><><><><><><><><><><><><><><><><><><><><><><><><><> ?<><><><><><><><><><><><><><><><><><><><><><><><><><><><><><><><><> ?-   Test results slightly outside the reference range are not unusual. ?If there is anything important, I will review this with you,  ?otherwise it is considered normal test values.  ?If you have further questions,  ?please do not hesitate to contact me at the office or via My Chart.  ?<><><><><><><><><><><><><><><><><><><><><><><><><><><><><><><><><> ?<><><><><><><><><><><><><><><><><><><><><><><><><><><><><><><><><> ? ?-  Total Chol = 206      &        Bad LDL Chol = 101 - Both OK,   ?                                                                              since have a very high HDL of 86  ! ?<><><><><><><><><><><><><><><><><><><><><><><><><><><><><><><><><> ?<><><><><><><><><><><><><><><><><><><><><><><><><><><><><><><><><> ? ?-  Iron Levels & Vitamin B12 levels are both OK  ?<><><><><><><><><><><><><><><><><><><><><><><><><><><><><><><><><> ?<><><><><><><><><><><><><><><><><><><><><><><><><><><><><><><><><> ? ?-  A1c - Normal - No Diabetes  - Great ! ?<><><><><><><><><><><><><><><><><><><><><><><><><><><><><><><><><> ?<><><><><><><><><><><><><><><><><><><><><><><><><><><><><><><><><> ? ?-  Vitamin D = 91 - Excellent  ! ?<><><><><><><><><><><><><><><><><><><><><><><><><><><><><><><><><> ?<><><><><><><><><><><><><><><><><><><><><><><><><><><><><><><><><> ? ?-  All Else - CBC - Kidneys - Electrolytes - Liver - Magnesium & Thyroid   ? ?- all  Normal / OK ?<><><><><><><><><><><><><><><><><><><><><><><><><><><><><><><><><> ?<><><><><><><><><><><><><><><><><><><><><><><><><><><><><><><><><> ? ? ? ? ? ? ? ? ? ? ? ? ? ? ? ? ? ? ? ? ? ? ? ?

## 2021-08-13 ENCOUNTER — Other Ambulatory Visit: Payer: Self-pay | Admitting: Internal Medicine

## 2021-08-13 DIAGNOSIS — F418 Other specified anxiety disorders: Secondary | ICD-10-CM

## 2021-09-08 ENCOUNTER — Other Ambulatory Visit: Payer: Self-pay

## 2021-09-08 DIAGNOSIS — Z1211 Encounter for screening for malignant neoplasm of colon: Secondary | ICD-10-CM

## 2021-09-08 DIAGNOSIS — Z1212 Encounter for screening for malignant neoplasm of rectum: Secondary | ICD-10-CM

## 2021-09-08 LAB — POC HEMOCCULT BLD/STL (HOME/3-CARD/SCREEN)
Card #2 Fecal Occult Blod, POC: NEGATIVE
Card #3 Fecal Occult Blood, POC: NEGATIVE
Fecal Occult Blood, POC: NEGATIVE

## 2021-09-15 ENCOUNTER — Encounter: Payer: Self-pay | Admitting: Internal Medicine

## 2021-09-15 ENCOUNTER — Other Ambulatory Visit: Payer: Self-pay | Admitting: Internal Medicine

## 2021-09-15 DIAGNOSIS — R21 Rash and other nonspecific skin eruption: Secondary | ICD-10-CM

## 2021-09-17 ENCOUNTER — Encounter: Payer: Self-pay | Admitting: Internal Medicine

## 2021-09-17 ENCOUNTER — Other Ambulatory Visit: Payer: Self-pay | Admitting: Internal Medicine

## 2021-09-17 MED ORDER — DEXAMETHASONE 2 MG PO TABS: ORAL_TABLET | ORAL | 0 refills | Status: DC

## 2022-01-10 ENCOUNTER — Other Ambulatory Visit: Payer: Self-pay | Admitting: Internal Medicine

## 2022-01-10 DIAGNOSIS — L709 Acne, unspecified: Secondary | ICD-10-CM

## 2022-01-10 MED ORDER — TRETINOIN 0.1 % EX CREA: TOPICAL_CREAM | CUTANEOUS | 3 refills | Status: DC

## 2022-03-07 ENCOUNTER — Other Ambulatory Visit: Payer: Self-pay | Admitting: Internal Medicine

## 2022-05-19 ENCOUNTER — Other Ambulatory Visit: Payer: Self-pay | Admitting: Internal Medicine

## 2022-05-19 DIAGNOSIS — L709 Acne, unspecified: Secondary | ICD-10-CM

## 2022-06-29 ENCOUNTER — Encounter: Payer: Self-pay | Admitting: Internal Medicine

## 2022-06-29 ENCOUNTER — Ambulatory Visit (INDEPENDENT_AMBULATORY_CARE_PROVIDER_SITE_OTHER): Payer: Managed Care, Other (non HMO) | Admitting: Internal Medicine

## 2022-06-29 VITALS — BP 138/80 | HR 79 | Temp 97.9°F | Resp 17 | Ht 61.0 in | Wt 124.2 lb

## 2022-06-29 DIAGNOSIS — R0989 Other specified symptoms and signs involving the circulatory and respiratory systems: Secondary | ICD-10-CM | POA: Diagnosis not present

## 2022-06-29 DIAGNOSIS — Z131 Encounter for screening for diabetes mellitus: Secondary | ICD-10-CM | POA: Diagnosis not present

## 2022-06-29 DIAGNOSIS — Z0000 Encounter for general adult medical examination without abnormal findings: Secondary | ICD-10-CM

## 2022-06-29 DIAGNOSIS — Z1211 Encounter for screening for malignant neoplasm of colon: Secondary | ICD-10-CM

## 2022-06-29 DIAGNOSIS — Z136 Encounter for screening for cardiovascular disorders: Secondary | ICD-10-CM | POA: Diagnosis not present

## 2022-06-29 DIAGNOSIS — Z130 Encounter for screening for diseases of the blood and blood-forming organs and certain disorders involving the immune mechanism: Secondary | ICD-10-CM | POA: Diagnosis not present

## 2022-06-29 DIAGNOSIS — Z0001 Encounter for general adult medical examination with abnormal findings: Secondary | ICD-10-CM

## 2022-06-29 DIAGNOSIS — E782 Mixed hyperlipidemia: Secondary | ICD-10-CM

## 2022-06-29 DIAGNOSIS — Z1389 Encounter for screening for other disorder: Secondary | ICD-10-CM | POA: Diagnosis not present

## 2022-06-29 DIAGNOSIS — R7309 Other abnormal glucose: Secondary | ICD-10-CM

## 2022-06-29 DIAGNOSIS — E559 Vitamin D deficiency, unspecified: Secondary | ICD-10-CM

## 2022-06-29 DIAGNOSIS — Z79899 Other long term (current) drug therapy: Secondary | ICD-10-CM | POA: Diagnosis not present

## 2022-06-29 DIAGNOSIS — R5383 Other fatigue: Secondary | ICD-10-CM

## 2022-06-29 DIAGNOSIS — Z13220 Encounter for screening for lipoid disorders: Secondary | ICD-10-CM

## 2022-06-29 MED ORDER — PHENTERMINE HCL 37.5 MG PO TABS: ORAL_TABLET | ORAL | 1 refills | Status: DC

## 2022-06-29 MED ORDER — METFORMIN HCL ER 500 MG PO TB24: ORAL_TABLET | ORAL | 3 refills | Status: DC

## 2022-06-29 NOTE — Progress Notes (Signed)
Annual Screening/Preventative Visit & Comprehensive Evaluation &  Examination  Future Appointments  Date Time Provider Department  06/29/2022  2:00 PM Unk Pinto, MD [facility]  07/05/2023  2:00 PM Unk Pinto, MD [facility]        This very nice 62 y.o. [facility] presents for a Screening /Preventative Visit & comprehensive evaluation and management of multiple medical co-morbidities.  Patient has been followed for labile HTN, HLD, Prediabetes  and Vitamin D Deficiency.        Patient is followed expectantly for labile hypertension.   Patient's BP has been controlled at home and patient denies any cardiac symptoms as chest pain, palpitations, shortness of breath, dizziness or ankle swelling. Today's BP is at goal - 138/80 .          Patient's hyperlipidemia is controlled with diet and medications. Patient denies myalgias or other medication SE's. Last lipids were at goal :  Lab Results  Component Value Date   CHOL 206 (H) 06/24/2021   HDL 86 06/24/2021   LDLCALC 101 (H) 06/24/2021   TRIG 97 06/24/2021   CHOLHDL 2.4 06/24/2021         Patient has been monitored expectantly for glucose intolerance  and patient denies reactive hypoglycemic symptoms, visual blurring, diabetic polys or paresthesias. Last A1c was normal & at goal :  Lab Results  Component Value Date   HGBA1C 4.9 06/24/2021           Finally, patient has history of Vitamin D Deficiency and last Vitamin D was at goal :  Lab Results  Component Value Date   VD25OH 70 06/24/2021         Current Outpatient Medications on File Prior to Visit  Medication Sig   VITAMIN C 1000 MG tablet Take daily.   buPROPion -XL  300 MG  Take  1 tablet  Daily  for Mood, Focus & Concentration   CITRACAL  Take daily.   DIVIGEL 1 MG/GM GEL Apply daily   ipratropium 0.06 % nasal spray USE 1 TO 2 SPRAYS EACH NOSTRIL 3 TO 4 TIMES A DAY   levocetirizine  5 MG tablet Take  1 tablet  Daily  for Allergies   Multiple  Vitamins-Minerals  Take daily.   D-Mannose 500 mg  daily   Quinol Immune Support 1 Takes  daily   ALIGN  Take daily.   progesterone  100 MG capsule Take    Pseudoephedrine 120 MG 12 hr  Take  1 tablet 2 x /day (every 12 hours)      Quercetin 50 MG TABS Take by mouth daily.   tretinoin (RETIN-A) 0.1 % cream Apply topically at bedtime. Apply at bedtime topically.   TURMERIC CURCUMIN  Take  daily.   Vit D-Vit K (VIT K2-VIT D3 ) Take  daily.     Allergies  Allergen Reactions   Dtap-Ipv Vaccine Hives   Flexeril [Cyclobenzaprine]      Past Medical History:  Diagnosis Date   GERD (gastroesophageal reflux disease)    Hypertension    Palpitations    Poor sleep hygiene      Health Maintenance  Topic Date Due   Zoster Vaccines- Shingrix (1 of 2) Never done   PAP SMEAR-Modifier  Never done   COVID-19 Vaccine (3 - Pfizer risk series) 10/01/2019   INFLUENZA VACCINE  11/17/2020   MAMMOGRAM  06/08/2022   Fecal DNA (Cologuard)  07/30/2023   Hepatitis C Screening  Completed   HIV Screening  Completed  HPV VACCINES  Aged Out   TETANUS/TDAP  Discontinued     Immunization History  Administered Date(s) Administered   Influenza 02/17/2018   PFIZER SARS-COV-2 Vacc 08/03/2019, 09/03/2019   PPD Test 03/08/2018, 03/29/2019, 04/28/2020   Pneumococcal -23 10/29/2008     Cologard - Negative 07/18/2017 -  Cologard - Negative 07/28/2020 - recc 3 year  f/u due Apr 2025  Last MGM - 06/08/2021   Past Surgical History:  Procedure Laterality Date   SQUINT REPAIR Right 1974     Family History  Problem Relation Age of Onset   Cancer Mother    COPD Father      Social History   Tobacco Use   Smoking status: Never   Smokeless tobacco: Never  Substance Use Topics   Alcohol use: Yes    Alcohol/week: 10.0 standard drinks    Types: 10 Standard drinks or equivalent per week    Comment: wine   Drug use: No      ROS Constitutional: Denies fever, chills, weight loss/gain,  headaches, insomnia,  night sweats, and change in appetite. Does c/o fatigue. Eyes: Denies redness, blurred vision, diplopia, discharge, itchy, watery eyes.  ENT: Denies discharge, congestion, post nasal drip, epistaxis, sore throat, earache, hearing loss, dental pain, Tinnitus, Vertigo, Sinus pain, snoring.  Cardio: Denies chest pain, palpitations, irregular heartbeat, syncope, dyspnea, diaphoresis, orthopnea, PND, claudication, edema Respiratory: denies cough, dyspnea, DOE, pleurisy, hoarseness, laryngitis, wheezing.  Gastrointestinal: Denies dysphagia, heartburn, reflux, water brash, pain, cramps, nausea, vomiting, bloating, diarrhea, constipation, hematemesis, melena, hematochezia, jaundice, hemorrhoids Genitourinary: Denies dysuria, frequency, urgency, nocturia, hesitancy, discharge, hematuria, flank pain Breast: Breast lumps, nipple discharge, bleeding.  Musculoskeletal: Denies arthralgia, myalgia, stiffness, Jt. Swelling, pain, limp, and strain/sprain. Denies falls. Skin: Denies puritis, rash, hives, warts, acne, eczema, changing in skin lesion Neuro: No weakness, tremor, incoordination, spasms, paresthesia, pain Psychiatric: Denies confusion, memory loss, sensory loss. Denies Depression. Endocrine: Denies change in weight, skin, hair change, nocturia, and paresthesia, diabetic polys, visual blurring, hyper / hypo glycemic episodes.  Heme/Lymph: No excessive bleeding, bruising, enlarged lymph nodes.  Physical Exam  There were no vitals taken for this visit.  General Appearance: Well nourished, well groomed and in no apparent distress.  Eyes: PERRLA, EOMs, conjunctiva no swelling or erythema, normal fundi and vessels. Sinuses: No frontal/maxillary tenderness ENT/Mouth: EACs patent / TMs  nl. Nares clear without erythema, swelling, mucoid exudates. Oral hygiene is good. No erythema, swelling, or exudate. Tongue normal, non-obstructing. Tonsils not swollen or erythematous. Hearing normal.   Neck: Supple, thyroid not palpable. No bruits, nodes or JVD. Respiratory: Respiratory effort normal.  BS equal and clear bilateral without rales, rhonci, wheezing or stridor. Cardio: Heart sounds are normal with regular rate and rhythm and no murmurs, rubs or gallops. Peripheral pulses are normal and equal bilaterally without edema. No aortic or femoral bruits. Chest: symmetric with normal excursions and percussion. Breasts: deferred to Gyn Abdomen: Flat, soft with bowel sounds active. Nontender, no guarding, rebound, hernias, masses, or organomegaly.  Lymphatics: Non tender without lymphadenopathy.  Musculoskeletal: Full ROM all peripheral extremities, joint stability, 5/5 strength, and normal gait. Skin: Warm and dry without rashes, lesions, cyanosis, clubbing or  ecchymosis.  Neuro: Cranial nerves intact, reflexes equal bilaterally. Normal muscle tone, no cerebellar symptoms. Sensation intact.  Pysch: Alert and oriented X 3, normal affect, Insight and Judgment appropriate.    Assessment and Plan  1. Annual Preventative Screening Examination  2. Labile hypertension  - EKG 12-Lead - [place], RETROPERITNL ABD,  LTD -  Urinalysis, Routine w reflex microscopic - Microalbumin / creatinine urine ratio - CBC with Differential/Platelet - COMPLETE METABOLIC PANEL WITH GFR - Magnesium - TSH  3. Hyperlipidemia, mixed  - EKG 12-Lead - [place], RETROPERITNL ABD,  LTD - Lipid panel - TSH  4. Abnormal glucose  - EKG 12-Lead - [place], RETROPERITNL ABD,  LTD - Hemoglobin A1c - Insulin, random  5. Vitamin D deficiency  - VITAMIN D 25 Hydroxy   6. Screening for colorectal cancer  - POC Hemoccult Bld/Stl   7. Screening for heart disease  - EKG 12-Lead  8. Screening for AAA (aortic abdominal aneurysm)  - [place], RETROPERITNL ABD,  LTD  9. Fatigue, - Iron, Total/Total Iron Binding Cap - Vitamin B12  10. Medication management  - Urinalysis, Routine w reflex microscopic - Microalbumin /  creatinine urine ratio - Vitamin B12 - CBC with Differential/Platelet - COMPLETE METABOLIC PANEL WITH GFR - Magnesium - Lipid panel - TSH - Hemoglobin A1c - Insulin, random - VITAMIN D 25 Hydroxy          Patient was counseled in prudent diet to achieve/maintain BMI less than 25 for weight control, BP monitoring, regular exercise and medications. Discussed med's effects and SE's. Screening labs and tests as requested with regular follow-up as recommended. Over 40 minutes of exam, counseling, chart review and high complex critical decision making was performed.   Kirtland Bouchard, MD

## 2022-06-29 NOTE — Patient Instructions (Signed)
Due to recent changes in healthcare laws, you may see the results of your imaging and laboratory studies on MyChart before your provider has had a chance to review them.  We understand that in some cases there may be results that are confusing or concerning to you. Not all laboratory results come back in the same time frame and the provider may be waiting for multiple results in order to interpret others.  Please give us 48 hours in order for your provider to thoroughly review all the results before contacting the office for clarification of your results.   ++++++++++++++++++++++++++++++  Vit D  & Vit C 1,000 mg   are recommended to help protect  against the Covid-19 and other Corona viruses.    Also it's recommended  to take  Zinc 50 mg  to help  protect against the Covid-19   and best place to get  is also on [employer]  and don't pay more than 6-8 cents /pill !  ================================ Coronavirus (COVID-19) Are you at risk?  Are you at risk for the Coronavirus (COVID-19)?  To be considered HIGH RISK for Coronavirus (COVID-19), you have to meet the following criteria:  Traveled to [place], [place], [place], [place] or [place]; or in the [place] to [place], [place], [place]  or [place]; and have fever, cough, and shortness of breath within the last 2 weeks of travel OR Been in close contact with a person diagnosed with COVID-19 within the last 2 weeks and have  fever, cough,and shortness of breath  IF YOU DO NOT MEET THESE CRITERIA, YOU ARE CONSIDERED LOW RISK FOR COVID-19.  What to do if you are HIGH RISK for COVID-19?  If you are having a medical emergency, call 911. Seek medical care right away. Before you go to a doctor's office, urgent care or emergency department,  call ahead and tell them about your recent travel, contact with someone diagnosed with COVID-19   and your symptoms.  You should receive instructions from your physician's office regarding  next steps of care.  When you arrive at healthcare provider, tell the healthcare staff immediately you have returned from  visiting [place], [place], [place], [place] or [place]; or traveled in the [place] to [place], [place],  [place] or [place] in the last two weeks or you have been in close contact with a person diagnosed with  COVID-19 in the last 2 weeks.   Tell the health care staff about your symptoms: fever, cough and shortness of breath. After you have been seen by a medical provider, you will be either: Tested for (COVID-19) and discharged home on quarantine except to seek medical care if  symptoms worsen, and asked to  Stay home and avoid contact with others until you get your results (4-5 days)  Avoid travel on public transportation if possible (such as bus, train, or airplane) or Sent to the Emergency Department by EMS for evaluation, COVID-19 testing  and  possible admission depending on your condition and test results.  What to do if you are LOW RISK for COVID-19?  Reduce your risk of any infection by using the same precautions used for avoiding the common cold or flu:  Wash your hands often with soap and warm water for at least 20 seconds.  If soap and water are not readily available,  use an alcohol-based hand sanitizer with at least 60% alcohol.  If coughing or sneezing, cover your mouth and nose by coughing   or sneezing into the elbow areas of your shirt or coat,  into a tissue or into your sleeve (not your hands). Avoid shaking hands with others and consider head nods or verbal greetings only. Avoid touching your eyes, nose, or mouth with unwashed hands.  Avoid close contact with people who are sick. Avoid places or events with large numbers of people in one location, like concerts or sporting events. Carefully consider travel plans you have or are making. If you are planning any travel outside or inside the [place], visit the CDC's Travelers' Health webpage for  the latest health notices. If you have some symptoms but not all symptoms, continue to monitor at home and seek medical attention  if your symptoms worsen. If you are having a medical emergency, call 911. >>>>>>>>>>>>>>>>>>>>>>> Preventive Care for Adults  A healthy lifestyle and preventive care can promote health and wellness. Preventive health guidelines for women include the following key practices. A routine yearly physical is a good way to check with your health care provider about your health and preventive screening. It is a chance to share any concerns and updates on your health and to receive a thorough exam. Visit your dentist for a routine exam and preventive care every 6 months. Brush your teeth twice a day and floss once a day. Good oral hygiene prevents tooth decay and gum disease. The frequency of eye exams is based on your age, health, family medical history, use of contact lenses, and other factors. Follow your health care provider's recommendations for frequency of eye exams. Eat a healthy diet. Foods like vegetables, fruits, whole grains, low-fat dairy products, and lean protein foods contain the nutrients you need without too many calories. Decrease your intake of foods high in solid fats, added sugars, and salt. Eat the right amount of calories for you. Get information about a proper diet from your health care provider, if necessary. Regular physical exercise is one of the most important things you can do for your health. Most adults should get at least 150 minutes of moderate-intensity exercise (any activity that increases your heart rate and causes you to sweat) each week. In addition, most adults need muscle-strengthening exercises on 2 or more days a week. Maintain a healthy weight. The body mass index (BMI) is a screening tool to identify possible weight problems. It provides an estimate of body fat based on height and weight. Your health care provider can find your BMI and can  help you achieve or maintain a healthy weight. For adults 20 years and older: A BMI below 18.5 is considered underweight. A BMI of 18.5 to 24.9 is normal. A BMI of 25 to 29.9 is considered overweight. A BMI of 30 and above is considered obese. Maintain normal blood lipids and cholesterol levels by exercising and minimizing your intake of saturated fat. Eat a balanced diet with plenty of fruit and vegetables. Blood tests for lipids and cholesterol should begin at age 20 and be repeated every 5 years. If your lipid or cholesterol levels are high, you are over 50, or you are at high risk for heart disease, you may need your cholesterol levels checked more frequently. Ongoing high lipid and cholesterol levels should be treated with medicines if diet and exercise are not working. If you smoke, find out from your health care provider how to quit. If you do not use tobacco, do not start. Lung cancer screening is recommended for adults aged 55-80 years who are at high risk for developing   lung cancer because of a history of smoking. A yearly low-dose CT scan of the lungs is recommended for people who have at least a 30-pack-year history of smoking and are a current smoker or have quit within the past 15 years. A pack year of smoking is smoking an average of 1 pack of cigarettes a day for 1 year (for example: 1 pack a day for 30 years or 2 packs a day for 15 years). Yearly screening should continue until the smoker has stopped smoking for at least 15 years. Yearly screening should be stopped for people who develop a health problem that would prevent them from having lung cancer treatment. High blood pressure causes heart disease and increases the risk of stroke. Your blood pressure should be checked at least every 1 to 2 years. Ongoing high blood pressure should be treated with medicines if weight loss and exercise do not work. If you are 55-79 years old, ask your health care provider if you should take aspirin to  prevent strokes. Diabetes screening involves taking a blood sample to check your fasting blood sugar level. This should be done once every 3 years, after age 45, if you are within normal weight and without risk factors for diabetes. Testing should be considered at a younger age or be carried out more frequently if you are overweight and have at least 1 risk factor for diabetes. Breast cancer screening is essential preventive care for women. You should practice "breast self-awareness." This means understanding the normal appearance and feel of your breasts and may include breast self-examination. Any changes detected, no matter how small, should be reported to a health care provider. Women in their 20s and 30s should have a clinical breast exam (CBE) by a health care provider as part of a regular health exam every 1 to 3 years. After age 40, women should have a CBE every year. Starting at age 40, women should consider having a mammogram (breast X-ray test) every year. Women who have a family history of breast cancer should talk to their health care provider about genetic screening. Women at a high risk of breast cancer should talk to their health care providers about having an MRI and a mammogram every year. Breast cancer gene (BRCA)-related cancer risk assessment is recommended for women who have family members with BRCA-related cancers. BRCA-related cancers include breast, ovarian, tubal, and peritoneal cancers. Having family members with these cancers may be associated with an increased risk for harmful changes (mutations) in the breast cancer genes BRCA1 and BRCA2. Results of the assessment will determine the need for genetic counseling and BRCA1 and BRCA2 testing. Routine pelvic exams to screen for cancer are no longer recommended for nonpregnant women who are considered low risk for cancer of the pelvic organs (ovaries, uterus, and vagina) and who do not have symptoms. Ask your health care provider if a  screening pelvic exam is right for you. If you have had past treatment for cervical cancer or a condition that could lead to cancer, you need Pap tests and screening for cancer for at least 20 years after your treatment. If Pap tests have been discontinued, your risk factors (such as having a new sexual partner) need to be reassessed to determine if screening should be resumed. Some women have medical problems that increase the chance of getting cervical cancer. In these cases, your health care provider may recommend more frequent screening and Pap tests. Colorectal cancer can be detected and often prevented. Most routine colorectal   cancer screening begins at the age of 50 years and continues through age 75 years. However, your health care provider may recommend screening at an earlier age if you have risk factors for colon cancer. On a yearly basis, your health care provider may provide home test kits to check for hidden blood in the stool. Use of a small camera at the end of a tube, to directly examine the colon (sigmoidoscopy or colonoscopy), can detect the earliest forms of colorectal cancer. Talk to your health care provider about this at age 50, when routine screening begins.  Direct exam of the colon should be repeated every 5-10 years through age 75 years, unless early forms of pre-cancerous polyps or small growths are found. Hepatitis C blood testing is recommended for all people born from 1945 through 1965 and any individual with known risks for hepatitis C.  Osteoporosis is a disease in which the bones lose minerals and strength with aging. This can result in serious bone fractures or breaks. The risk of osteoporosis can be identified using a bone density scan. Women ages 65 years and over and women at risk for fractures or osteoporosis should discuss screening with their health care providers. Ask your health care provider whether you should take a calcium supplement or vitamin D to reduce the rate  of osteoporosis. Menopause can be associated with physical symptoms and risks. Hormone replacement therapy is available to decrease symptoms and risks. You should talk to your health care provider about whether hormone replacement therapy is right for you. Use sunscreen. Apply sunscreen liberally and repeatedly throughout the day. You should seek shade when your shadow is shorter than you. Protect yourself by wearing long sleeves, pants, a wide-brimmed hat, and sunglasses year round, whenever you are outdoors. Once a month, do a whole body skin exam, using a mirror to look at the skin on your back. Tell your health care provider of new moles, moles that have irregular borders, moles that are larger than a pencil eraser, or moles that have changed in shape or color. Stay current with required vaccines (immunizations). Influenza vaccine. All adults should be immunized every year. Tetanus, diphtheria, and acellular pertussis (Td, Tdap) vaccine. Pregnant women should receive 1 dose of Tdap vaccine during each pregnancy. The dose should be obtained regardless of the length of time since the last dose. Immunization is preferred during the 27th-36th week of gestation. An adult who has not previously received Tdap or who does not know her vaccine status should receive 1 dose of Tdap. This initial dose should be followed by tetanus and diphtheria toxoids (Td) booster doses every 10 years. Adults with an unknown or incomplete history of completing a 3-dose immunization series with Td-containing vaccines should begin or complete a primary immunization series including a Tdap dose. Adults should receive a Td booster every 10 years. Varicella vaccine. An adult without evidence of immunity to varicella should receive 2 doses or a second dose if she has previously received 1 dose. Pregnant females who do not have evidence of immunity should receive the first dose after pregnancy. This first dose should be obtained before  leaving the health care facility. The second dose should be obtained 4-8 weeks after the first dose. Human papillomavirus (HPV) vaccine. Females aged 13-26 years who have not received the vaccine previously should obtain the 3-dose series. The vaccine is not recommended for use in pregnant females. However, pregnancy testing is not needed before receiving a dose. If a female is found to   be pregnant after receiving a dose, no treatment is needed. In that case, the remaining doses should be delayed until after the pregnancy. Immunization is recommended for any person with an immunocompromised condition through the age of 26 years if she did not get any or all doses earlier. During the 3-dose series, the second dose should be obtained 4-8 weeks after the first dose. The third dose should be obtained 24 weeks after the first dose and 16 weeks after the second dose. Zoster vaccine. One dose is recommended for adults aged 60 years or older unless certain conditions are present. Measles, mumps, and rubella (MMR) vaccine. Adults born before 1957 generally are considered immune to measles and mumps. Adults born in 1957 or later should have 1 or more doses of MMR vaccine unless there is a contraindication to the vaccine or there is laboratory evidence of immunity to each of the three diseases. A routine second dose of MMR vaccine should be obtained at least 28 days after the first dose for students attending postsecondary schools, health care workers, or international travelers. People who received inactivated measles vaccine or an unknown type of measles vaccine during 1963-1967 should receive 2 doses of MMR vaccine. People who received inactivated mumps vaccine or an unknown type of mumps vaccine before 1979 and are at high risk for mumps infection should consider immunization with 2 doses of MMR vaccine. For females of childbearing age, rubella immunity should be determined. If there is no evidence of immunity, females  who are not pregnant should be vaccinated. If there is no evidence of immunity, females who are pregnant should delay immunization until after pregnancy. Unvaccinated health care workers born before 1957 who lack laboratory evidence of measles, mumps, or rubella immunity or laboratory confirmation of disease should consider measles and mumps immunization with 2 doses of MMR vaccine or rubella immunization with 1 dose of MMR vaccine. Pneumococcal 13-valent conjugate (PCV13) vaccine. When indicated, a person who is uncertain of her immunization history and has no record of immunization should receive the PCV13 vaccine. An adult aged 19 years or older who has certain medical conditions and has not been previously immunized should receive 1 dose of PCV13 vaccine. This PCV13 should be followed with a dose of pneumococcal polysaccharide (PPSV23) vaccine. The PPSV23 vaccine dose should be obtained at least 1 or more year(s) after the dose of PCV13 vaccine. An adult aged 19 years or older who has certain medical conditions and previously received 1 or more doses of PPSV23 vaccine should receive 1 dose of PCV13. The PCV13 vaccine dose should be obtained 1 or more years after the last PPSV23 vaccine dose.  Pneumococcal polysaccharide (PPSV23) vaccine. When PCV13 is also indicated, PCV13 should be obtained first. All adults aged 65 years and older should be immunized. An adult younger than age 65 years who has certain medical conditions should be immunized. Any person who resides in a nursing home or long-term care facility should be immunized. An adult smoker should be immunized. People with an immunocompromised condition and certain other conditions should receive both PCV13 and PPSV23 vaccines. People with human immunodeficiency virus (HIV) infection should be immunized as soon as possible after diagnosis. Immunization during chemotherapy or radiation therapy should be avoided. Routine use of PPSV23 vaccine is not  recommended for American Indians, [place] Natives, or people younger than 65 years unless there are medical conditions that require PPSV23 vaccine. When indicated, people who have unknown immunization and have no record of immunization should receive   PPSV23 vaccine. One-time revaccination 5 years after the first dose of PPSV23 is recommended for people aged 19-64 years who have chronic kidney failure, nephrotic syndrome, asplenia, or immunocompromised conditions. People who received 1-2 doses of PPSV23 before age 65 years should receive another dose of PPSV23 vaccine at age 65 years or later if at least 5 years have passed since the previous dose. Doses of PPSV23 are not needed for people immunized with PPSV23 at or after age 65 years.  Preventive Services / Frequency  Ages 40 to 64 years Blood pressure check. Lipid and cholesterol check. Lung cancer screening. / Every year if you are aged 55-80 years and have a 30-pack-year history of smoking and currently smoke or have quit within the past 15 years. Yearly screening is stopped once you have quit smoking for at least 15 years or develop a health problem that would prevent you from having lung cancer treatment. Clinical breast exam.** / Every year after age 40 years.  BRCA-related cancer risk assessment.** / For women who have family members with a BRCA-related cancer (breast, ovarian, tubal, or peritoneal cancers). Mammogram.** / Every year beginning at age 40 years and continuing for as long as you are in good health. Consult with your health care provider. Pap test.** / Every 3 years starting at age 30 years through age 65 or 70 years with a history of 3 consecutive normal Pap tests. HPV screening.** / Every 3 years from ages 30 years through ages 65 to 70 years with a history of 3 consecutive normal Pap tests. Fecal occult blood test (FOBT) of stool. / Every year beginning at age 50 years and continuing until age 75 years. You may not need to do this  test if you get a colonoscopy every 10 years. Flexible sigmoidoscopy or colonoscopy.** / Every 5 years for a flexible sigmoidoscopy or every 10 years for a colonoscopy beginning at age 50 years and continuing until age 75 years. Hepatitis C blood test.** / For all people born from 1945 through 1965 and any individual with known risks for hepatitis C. Skin self-exam. / Monthly. Influenza vaccine. / Every year. Tetanus, diphtheria, and acellular pertussis (Tdap/Td) vaccine.** / Consult your health care provider. Pregnant women should receive 1 dose of Tdap vaccine during each pregnancy. 1 dose of Td every 10 years. Varicella vaccine.** / Consult your health care provider. Pregnant females who do not have evidence of immunity should receive the first dose after pregnancy. Zoster vaccine.** / 1 dose for adults aged 60 years or older. Pneumococcal 13-valent conjugate (PCV13) vaccine.** / Consult your health care provider. Pneumococcal polysaccharide (PPSV23) vaccine.** / 1 to 2 doses if you smoke cigarettes or if you have certain conditions. Meningococcal vaccine.** / Consult your health care provider. Hepatitis A vaccine.** / Consult your health care provider. Hepatitis B vaccine.** / Consult your health care provider. Screening for abdominal aortic aneurysm (AAA)  by ultrasound is recommended for people over 50 who have history of high blood pressure or who are current or former smokers. ++++++++++++++++++ Recommend Adult Low Dose Aspirin or  coated  Aspirin 81 mg daily  To reduce risk of Colon Cancer 40 %,  Skin Cancer 26 % ,  Melanoma 46%  and  Pancreatic cancer 60% +++++++++++++++++++ Vitamin D goal  is between 70-100.  Please make sure that you are taking your Vitamin D as directed.  It is very important as a natural anti-inflammatory  helping hair, skin, and nails, as well as reducing stroke and heart   attack risk.  It helps your bones and helps with mood. It also decreases numerous  cancer risks so please take it as directed.  Low Vit D is associated with a 200-300% higher risk for CANCER  and 200-300% higher risk for HEART   ATTACK  &  STROKE.   ...................................... It is also associated with higher death rate at younger ages,  autoimmune diseases like Rheumatoid arthritis, Lupus, Multiple Sclerosis.    Also many other serious conditions, like depression, Alzheimer's Dementia, infertility, muscle aches, fatigue, fibromyalgia - just to name a few. ++++++++++++++++++ Recommend the book "The END of DIETING" by Dr Joel Fuhrman  & the book "The END of DIABETES " by Dr Joel Fuhrman At [employer] - get book & Audio CD's    Being diabetic has a  300% increased risk for heart attack, stroke, cancer, and alzheimer- type vascular dementia. It is very important that you work harder with diet by avoiding all foods that are white. Avoid white rice (brown & wild rice is OK), white potatoes (sweetpotatoes in moderation is OK), White bread or wheat bread or anything made out of white flour like bagels, donuts, rolls, buns, biscuits, cakes, pastries, cookies, pizza crust, and pasta (made from white flour & egg whites) - vegetarian pasta or spinach or wheat pasta is OK. Multigrain breads like Arnold's or [employer], or multigrain sandwich thins or flatbreads.  Diet, exercise and weight loss can reverse and cure diabetes in the early stages.  Diet, exercise and weight loss is very important in the control and prevention of complications of diabetes which affects every system in your body, ie. Brain - dementia/stroke, eyes - glaucoma/blindness, heart - heart attack/heart failure, kidneys - dialysis, stomach - gastric paralysis, intestines - malabsorption, nerves - severe painful neuritis, circulation - gangrene & loss of a leg(s), and finally cancer and Alzheimers.    I recommend avoid fried & greasy foods,  sweets/candy, white rice (brown or wild rice or Quinoa is OK), white  potatoes (sweet potatoes are OK) - anything made from white flour - bagels, doughnuts, rolls, buns, biscuits,white and wheat breads, pizza crust and traditional pasta made of white flour & egg white(vegetarian pasta or spinach or wheat pasta is OK).  Multi-grain bread is OK - like multi-grain flat bread or sandwich thins. Avoid alcohol in excess. Exercise is also important.    Eat all the vegetables you want - avoid meat, especially red meat and dairy - especially cheese.  Cheese is the most concentrated form of trans-fats which is the worst thing to clog up our arteries. Veggie cheese is OK which can be found in the fresh produce section at [employer] or [employer] or [employer]  ++++++++++++++++++++++ DASH Eating Plan  DASH stands for "Dietary Approaches to Stop Hypertension."   The DASH eating plan is a healthy eating plan that has been shown to reduce high blood pressure (hypertension). Additional health benefits may include reducing the risk of type 2 diabetes mellitus, heart disease, and stroke. The DASH eating plan may also help with weight loss. WHAT DO I NEED TO KNOW ABOUT THE DASH EATING PLAN? For the DASH eating plan, you will follow these general guidelines: Choose foods with a percent daily value for sodium of less than 5% (as listed on the food label). Use salt-free seasonings or herbs instead of table salt or sea salt. Check with your health care provider or pharmacist before using salt substitutes. Eat lower-sodium products, often labeled as "lower sodium" or "no   salt added." Eat fresh foods. Eat more vegetables, fruits, and low-fat dairy products. Choose whole grains. Look for the word "whole" as the first word in the ingredient list. Choose fish  Limit sweets, desserts, sugars, and sugary drinks. Choose heart-healthy fats. Eat veggie cheese  Eat more home-cooked food and less restaurant, buffet, and fast food. Limit fried foods. Cook foods using methods other than  frying. Limit canned vegetables. If you do use them, rinse them well to decrease the sodium. When eating at a restaurant, ask that your food be prepared with less salt, or no salt if possible.                      WHAT FOODS CAN I EAT? Read Dr Joel Fuhrman's books on The End of Dieting & The End of Diabetes  Grains Whole grain or whole wheat bread. Brown rice. Whole grain or whole wheat pasta. Quinoa, bulgur, and whole grain cereals. Low-sodium cereals. Corn or whole wheat flour tortillas. Whole grain cornbread. Whole grain crackers. Low-sodium crackers.  Vegetables Fresh or frozen vegetables (raw, steamed, roasted, or grilled). Low-sodium or reduced-sodium tomato and vegetable juices. Low-sodium or reduced-sodium tomato sauce and paste. Low-sodium or reduced-sodium canned vegetables.   Fruits All fresh, canned (in natural juice), or frozen fruits.  Protein Products  All fish and seafood.  Dried beans, peas, or lentils. Unsalted nuts and seeds. Unsalted canned beans.  Dairy Low-fat dairy products, such as skim or 1% milk, 2% or reduced-fat cheeses, low-fat ricotta or cottage cheese, or plain low-fat yogurt. Low-sodium or reduced-sodium cheeses.  Fats and Oils Tub margarines without trans fats. Light or reduced-fat mayonnaise and salad dressings (reduced sodium). Avocado. Safflower, olive, or canola oils. Natural peanut or almond butter.  Other Unsalted popcorn and pretzels. The items listed above may not be a complete list of recommended foods or beverages. Contact your dietitian for more options.  ++++++++++++++++++  WHAT FOODS ARE NOT RECOMMENDED? Grains/ White flour or wheat flour White bread. White pasta. White rice. Refined cornbread. Bagels and croissants. Crackers that contain trans fat.  Vegetables  Creamed or fried vegetables. Vegetables in a . Regular canned vegetables. Regular canned tomato sauce and paste. Regular tomato and vegetable juices.  Fruits Dried fruits.  Canned fruit in light or heavy syrup. Fruit juice.  Meat and Other Protein Products Meat in general - RED meat & White meat.  Fatty cuts of meat. Ribs, chicken wings, all processed meats as bacon, sausage, bologna, salami, fatback, hot dogs, bratwurst and packaged luncheon meats.  Dairy Whole or 2% milk, cream, half-and-half, and cream cheese. Whole-fat or sweetened yogurt. Full-fat cheeses or blue cheese. Non-dairy creamers and whipped toppings. Processed cheese, cheese spreads, or cheese curds.  Condiments Onion and garlic salt, seasoned salt, table salt, and sea salt. Canned and packaged gravies. Worcestershire sauce. Tartar sauce. Barbecue sauce. Teriyaki sauce. Soy sauce, including reduced sodium. Steak sauce. Fish sauce. Oyster sauce. Cocktail sauce. Horseradish. Ketchup and mustard. Meat flavorings and tenderizers. Bouillon cubes. Hot sauce. Tabasco sauce. Marinades. Taco seasonings. Relishes.  Fats and Oils Butter, stick margarine, lard, shortening and bacon fat. Coconut, palm kernel, or palm oils. Regular salad dressings.  Pickles and olives. Salted popcorn and pretzels.  The items listed above may not be a complete list of foods and beverages to avoid.   

## 2022-06-30 LAB — IRON, TOTAL/TOTAL IRON BINDING CAP
%SAT: 30 % (calc) (ref 16–45)
Iron: 90 ug/dL (ref 45–160)
TIBC: 305 mcg/dL (calc) (ref 250–450)

## 2022-06-30 LAB — CBC WITH DIFFERENTIAL/PLATELET
Absolute Monocytes: 470 cells/uL (ref 200–950)
Basophils Absolute: 22 cells/uL (ref 0–200)
Basophils Relative: 0.4 %
Eosinophils Absolute: 49 cells/uL (ref 15–500)
Eosinophils Relative: 0.9 %
HCT: 40 % (ref 35.0–45.0)
Hemoglobin: 13.4 g/dL (ref 11.7–15.5)
Lymphs Abs: 1647 cells/uL (ref 850–3900)
MCH: 30.5 pg (ref 27.0–33.0)
MCHC: 33.5 g/dL (ref 32.0–36.0)
MCV: 91.1 fL (ref 80.0–100.0)
MPV: 9.8 fL (ref 7.5–12.5)
Monocytes Relative: 8.7 %
Neutro Abs: 3213 cells/uL (ref 1500–7800)
Neutrophils Relative %: 59.5 %
Platelets: 214 10*3/uL (ref 140–400)
RBC: 4.39 10*6/uL (ref 3.80–5.10)
RDW: 12.3 % (ref 11.0–15.0)
Total Lymphocyte: 30.5 %
WBC: 5.4 10*3/uL (ref 3.8–10.8)

## 2022-06-30 LAB — COMPLETE METABOLIC PANEL WITH GFR
AG Ratio: 2.2 (calc) (ref 1.0–2.5)
ALT: 14 U/L (ref 6–29)
AST: 22 U/L (ref 10–35)
Albumin: 4.6 g/dL (ref 3.6–5.1)
Alkaline phosphatase (APISO): 37 U/L (ref 37–153)
BUN: 12 mg/dL (ref 7–25)
CO2: 26 mmol/L (ref 20–32)
Calcium: 9.6 mg/dL (ref 8.6–10.4)
Chloride: 106 mmol/L (ref 98–110)
Creat: 0.59 mg/dL (ref 0.50–1.05)
Globulin: 2.1 g/dL (calc) (ref 1.9–3.7)
Glucose, Bld: 76 mg/dL (ref 65–99)
Potassium: 4.6 mmol/L (ref 3.5–5.3)
Sodium: 144 mmol/L (ref 135–146)
Total Bilirubin: 0.6 mg/dL (ref 0.2–1.2)
Total Protein: 6.7 g/dL (ref 6.1–8.1)
eGFR: 103 mL/min/{1.73_m2} (ref >=60)

## 2022-06-30 LAB — VITAMIN B12: Vitamin B-12: 548 pg/mL (ref 200–1100)

## 2022-06-30 LAB — URINALYSIS, ROUTINE W REFLEX MICROSCOPIC
Bilirubin Urine: NEGATIVE
Glucose, UA: NEGATIVE
Hgb urine dipstick: NEGATIVE
Leukocytes,Ua: NEGATIVE
Nitrite: NEGATIVE
Protein, ur: NEGATIVE
Specific Gravity, Urine: 1.007 (ref 1.001–1.035)
pH: 6.5 (ref 5.0–8.0)

## 2022-06-30 LAB — HEMOGLOBIN A1C
Hgb A1c MFr Bld: 5.1 % of total Hgb (ref <5.7)
Mean Plasma Glucose: 100 mg/dL
eAG (mmol/L): 5.5 mmol/L

## 2022-06-30 LAB — VITAMIN D 25 HYDROXY (VIT D DEFICIENCY, FRACTURES): Vit D, 25-Hydroxy: 63 ng/mL (ref 30–100)

## 2022-06-30 LAB — LIPID PANEL
Cholesterol: 187 mg/dL (ref <200)
HDL: 89 mg/dL (ref >=50)
LDL Cholesterol (Calc): 85 mg/dL (calc)
Non-HDL Cholesterol (Calc): 98 mg/dL (calc) (ref <130)
Total CHOL/HDL Ratio: 2.1 (calc) (ref <5.0)
Triglycerides: 58 mg/dL (ref <150)

## 2022-06-30 LAB — MICROALBUMIN / CREATININE URINE RATIO
Creatinine, Urine: 43 mg/dL (ref 20–275)
Microalb Creat Ratio: 7 mcg/mg creat (ref <30)
Microalb, Ur: 0.3 mg/dL

## 2022-06-30 LAB — MAGNESIUM: Magnesium: 1.9 mg/dL (ref 1.5–2.5)

## 2022-06-30 LAB — INSULIN, RANDOM: Insulin: 3.2 u[IU]/mL

## 2022-06-30 LAB — TSH: TSH: 2.63 mIU/L (ref 0.40–4.50)

## 2022-06-30 NOTE — Progress Notes (Signed)
<><><><><><><><><><><><><><><><><><><><><><><><><><><><><><><><><> <><><><><><><><><><><><><><><><><><><><><><><><><><><><><><><><><> -   Test results slightly outside the reference range are not unusual. If there is anything important, I will review this with you,  otherwise it is considered normal test values.  If you have further questions,  please do not hesitate to contact me at the office or via My Chart.  <><><><><><><><><><><><><><><><><><><><><><><><><><><><><><><><><> <><><><><><><><><><><><><><><><><><><><><><><><><><><><><><><><><>  -  Iron & Vit B12  levels - Normal  <><><><><><><><><><><><><><><><><><><><><><><><><><><><><><><><><>  -  Chol = 187   &   LDL = 85   - Both . Excellent   - Very low risk for Heart Attack  / Stroke <><><><><><><><><><><><><><><><><><><><><><><><><><><><><><><><><>  -   A1c - Normal - No Diabetes  - Great  !  <><><><><><><><><><><><><><><><><><><><><><><><><><><><><><><><><>  -  Vit D = 25 - Great - Please continue dosage same  <><><><><><><><><><><><><><><><><><><><><><><><><><><><><><><><><>  -  All Else - CBC - Kidneys - Electrolytes - Liver - Magnesium & Thyroid    - all  Normal / OK <><><><><><><><><><><><><><><><><><><><><><><><><><><><><><><><><> <><><><><><><><><><><><><><><><><><><><><><><><><><><><><><><><><>  -  Keep up the Great Work   !  <><><><><><><><><><><><><><><><><><><><><><><><><><><><><><><><><> <><><><><><><><><><><><><><><><><><><><><><><><><><><><><><><><><>

## 2022-07-02 ENCOUNTER — Encounter: Payer: Self-pay | Admitting: Internal Medicine

## 2022-07-03 ENCOUNTER — Other Ambulatory Visit: Payer: Self-pay | Admitting: Internal Medicine

## 2022-07-03 DIAGNOSIS — F418 Other specified anxiety disorders: Secondary | ICD-10-CM

## 2022-07-03 MED ORDER — WELLBUTRIN XL 300 MG PO TB24: ORAL_TABLET | ORAL | 3 refills | Status: DC

## 2022-07-17 ENCOUNTER — Other Ambulatory Visit: Payer: Self-pay | Admitting: Internal Medicine

## 2022-07-19 MED ORDER — BUPROPION HCL ER (XL) 150 MG PO TB24: 150.0000 mg | ORAL_TABLET | Freq: Every day | ORAL | 2 refills | Status: DC

## 2022-08-04 ENCOUNTER — Other Ambulatory Visit: Payer: Self-pay | Admitting: Internal Medicine

## 2022-08-22 ENCOUNTER — Other Ambulatory Visit: Payer: Self-pay | Admitting: Nurse Practitioner

## 2022-08-22 DIAGNOSIS — F418 Other specified anxiety disorders: Secondary | ICD-10-CM

## 2022-09-02 ENCOUNTER — Other Ambulatory Visit: Payer: Self-pay | Admitting: Obstetrics & Gynecology

## 2022-09-02 DIAGNOSIS — Z1231 Encounter for screening mammogram for malignant neoplasm of breast: Secondary | ICD-10-CM

## 2022-09-17 ENCOUNTER — Other Ambulatory Visit: Payer: Self-pay | Admitting: Internal Medicine

## 2022-09-28 ENCOUNTER — Ambulatory Visit
Admission: RE | Admit: 2022-09-28 | Discharge: 2022-09-28 | Disposition: A | Discharge status: Home / Self Care | Payer: Managed Care, Other (non HMO) | Source: Ambulatory Visit | Attending: Obstetrics & Gynecology | Admitting: Obstetrics & Gynecology

## 2022-09-28 DIAGNOSIS — Z1231 Encounter for screening mammogram for malignant neoplasm of breast: Secondary | ICD-10-CM

## 2022-10-21 ENCOUNTER — Other Ambulatory Visit: Payer: Self-pay | Admitting: Nurse Practitioner

## 2022-12-15 ENCOUNTER — Other Ambulatory Visit: Payer: Self-pay

## 2022-12-15 MED ORDER — METFORMIN HCL ER 500 MG PO TB24: ORAL_TABLET | ORAL | 3 refills | Status: DC

## 2022-12-24 ENCOUNTER — Other Ambulatory Visit: Payer: Self-pay | Admitting: Internal Medicine

## 2022-12-24 DIAGNOSIS — L709 Acne, unspecified: Secondary | ICD-10-CM

## 2022-12-27 ENCOUNTER — Other Ambulatory Visit: Payer: Self-pay | Admitting: Internal Medicine

## 2022-12-27 DIAGNOSIS — L709 Acne, unspecified: Secondary | ICD-10-CM

## 2022-12-27 DIAGNOSIS — E663 Overweight: Secondary | ICD-10-CM

## 2022-12-27 MED ORDER — PHENTERMINE HCL 37.5 MG PO TABS
ORAL_TABLET | ORAL | 0 refills | Status: DC
Start: 2022-12-27 — End: 2022-12-30

## 2022-12-27 MED ORDER — TRETINOIN 0.1 % EX CREA
TOPICAL_CREAM | CUTANEOUS | 3 refills | Status: AC
Start: 2022-12-27 — End: ?

## 2022-12-30 ENCOUNTER — Other Ambulatory Visit: Payer: Self-pay | Admitting: Internal Medicine

## 2022-12-30 DIAGNOSIS — E663 Overweight: Secondary | ICD-10-CM

## 2022-12-30 MED ORDER — PHENTERMINE HCL 37.5 MG PO TABS
ORAL_TABLET | ORAL | 0 refills | Status: AC
Start: 2022-12-30 — End: ?

## 2023-01-19 ENCOUNTER — Telehealth: Payer: Self-pay | Admitting: Nurse Practitioner

## 2023-01-19 ENCOUNTER — Other Ambulatory Visit: Payer: Self-pay | Admitting: Nurse Practitioner

## 2023-01-19 DIAGNOSIS — E663 Overweight: Secondary | ICD-10-CM

## 2023-01-19 MED ORDER — PHENTERMINE HCL 37.5 MG PO TABS
ORAL_TABLET | ORAL | 0 refills | Status: DC
Start: 2023-01-19 — End: 2023-04-18

## 2023-01-19 MED ORDER — PHENTERMINE HCL 37.5 MG PO TABS
ORAL_TABLET | ORAL | 0 refills | Status: DC
Start: 2023-01-19 — End: 2023-01-19

## 2023-01-19 NOTE — Telephone Encounter (Signed)
Pt is requestng a refill on Phentermine. I know it cannot be refilled until 10/12 but, pt is leaving for vacation this weekend and needs it before she leaves if possible.  Pharm--- [facility], [place]

## 2023-03-14 ENCOUNTER — Other Ambulatory Visit: Payer: Self-pay | Admitting: Medical Genetics

## 2023-03-14 DIAGNOSIS — Z006 Encounter for examination for normal comparison and control in clinical research program: Secondary | ICD-10-CM

## 2023-03-21 ENCOUNTER — Other Ambulatory Visit (HOSPITAL_COMMUNITY): Payer: Self-pay

## 2023-03-31 ENCOUNTER — Other Ambulatory Visit (HOSPITAL_COMMUNITY): Payer: Self-pay

## 2023-04-01 ENCOUNTER — Other Ambulatory Visit (HOSPITAL_COMMUNITY): Payer: Self-pay | Attending: Medical Genetics

## 2023-04-14 ENCOUNTER — Other Ambulatory Visit: Payer: Self-pay | Admitting: Internal Medicine

## 2023-04-18 ENCOUNTER — Other Ambulatory Visit: Payer: Self-pay | Admitting: Nurse Practitioner

## 2023-04-18 ENCOUNTER — Other Ambulatory Visit: Payer: Self-pay | Admitting: Internal Medicine

## 2023-04-18 DIAGNOSIS — E663 Overweight: Secondary | ICD-10-CM

## 2023-04-18 MED ORDER — PHENTERMINE HCL 37.5 MG PO TABS: ORAL_TABLET | ORAL | 0 refills | Status: DC

## 2023-05-20 ENCOUNTER — Other Ambulatory Visit (HOSPITAL_COMMUNITY)
Admission: RE | Admit: 2023-05-20 | Discharge: 2023-05-20 | Disposition: A | Discharge status: Home / Self Care | Payer: Self-pay | Source: Ambulatory Visit | Attending: Medical Genetics | Admitting: Medical Genetics

## 2023-05-20 DIAGNOSIS — Z006 Encounter for examination for normal comparison and control in clinical research program: Secondary | ICD-10-CM | POA: Insufficient documentation

## 2023-05-30 LAB — GENECONNECT MOLECULAR SCREEN: Genetic Analysis Overall Interpretation: NEGATIVE

## 2023-06-15 DIAGNOSIS — Z6823 Body mass index (BMI) 23.0-23.9, adult: Secondary | ICD-10-CM | POA: Diagnosis not present

## 2023-06-15 DIAGNOSIS — Z713 Dietary counseling and surveillance: Secondary | ICD-10-CM | POA: Diagnosis not present

## 2023-07-05 ENCOUNTER — Encounter: Payer: Managed Care, Other (non HMO) | Admitting: Internal Medicine

## 2023-09-07 DIAGNOSIS — R634 Abnormal weight loss: Secondary | ICD-10-CM | POA: Diagnosis not present

## 2023-09-07 DIAGNOSIS — Z6821 Body mass index (BMI) 21.0-21.9, adult: Secondary | ICD-10-CM | POA: Diagnosis not present

## 2023-09-07 DIAGNOSIS — Z713 Dietary counseling and surveillance: Secondary | ICD-10-CM | POA: Diagnosis not present

## 2023-10-13 DIAGNOSIS — R87810 Cervical high risk human papillomavirus (HPV) DNA test positive: Secondary | ICD-10-CM | POA: Diagnosis not present

## 2023-10-13 DIAGNOSIS — R87610 Atypical squamous cells of undetermined significance on cytologic smear of cervix (ASC-US): Secondary | ICD-10-CM | POA: Diagnosis not present

## 2023-10-13 DIAGNOSIS — Z6822 Body mass index (BMI) 22.0-22.9, adult: Secondary | ICD-10-CM | POA: Diagnosis not present

## 2023-10-13 DIAGNOSIS — Z01419 Encounter for gynecological examination (general) (routine) without abnormal findings: Secondary | ICD-10-CM | POA: Diagnosis not present

## 2023-10-26 DIAGNOSIS — Z1211 Encounter for screening for malignant neoplasm of colon: Secondary | ICD-10-CM | POA: Diagnosis not present

## 2023-10-31 ENCOUNTER — Other Ambulatory Visit: Payer: Self-pay | Admitting: Obstetrics & Gynecology

## 2023-10-31 DIAGNOSIS — Z1231 Encounter for screening mammogram for malignant neoplasm of breast: Secondary | ICD-10-CM

## 2023-10-31 LAB — COLOGUARD: COLOGUARD: NEGATIVE

## 2023-11-01 DIAGNOSIS — M2011 Hallux valgus (acquired), right foot: Secondary | ICD-10-CM | POA: Diagnosis not present

## 2023-11-01 DIAGNOSIS — M25571 Pain in right ankle and joints of right foot: Secondary | ICD-10-CM | POA: Diagnosis not present

## 2023-11-01 DIAGNOSIS — M79672 Pain in left foot: Secondary | ICD-10-CM | POA: Diagnosis not present

## 2023-11-01 DIAGNOSIS — M79671 Pain in right foot: Secondary | ICD-10-CM | POA: Diagnosis not present

## 2023-11-17 DIAGNOSIS — Z1231 Encounter for screening mammogram for malignant neoplasm of breast: Secondary | ICD-10-CM

## 2023-11-23 DIAGNOSIS — H2513 Age-related nuclear cataract, bilateral: Secondary | ICD-10-CM | POA: Diagnosis not present

## 2023-11-23 DIAGNOSIS — E559 Vitamin D deficiency, unspecified: Secondary | ICD-10-CM | POA: Diagnosis not present

## 2023-11-23 DIAGNOSIS — M2550 Pain in unspecified joint: Secondary | ICD-10-CM | POA: Diagnosis not present

## 2023-11-23 DIAGNOSIS — H5203 Hypermetropia, bilateral: Secondary | ICD-10-CM | POA: Diagnosis not present

## 2023-11-23 DIAGNOSIS — R635 Abnormal weight gain: Secondary | ICD-10-CM | POA: Diagnosis not present

## 2023-11-23 DIAGNOSIS — H35371 Puckering of macula, right eye: Secondary | ICD-10-CM | POA: Diagnosis not present

## 2023-11-23 DIAGNOSIS — N951 Menopausal and female climacteric states: Secondary | ICD-10-CM | POA: Diagnosis not present

## 2023-11-23 DIAGNOSIS — E538 Deficiency of other specified B group vitamins: Secondary | ICD-10-CM | POA: Diagnosis not present

## 2023-11-23 DIAGNOSIS — R5383 Other fatigue: Secondary | ICD-10-CM | POA: Diagnosis not present

## 2023-12-01 ENCOUNTER — Ambulatory Visit
Admission: RE | Admit: 2023-12-01 | Discharge: 2023-12-01 | Disposition: A | Discharge status: Home / Self Care | Source: Ambulatory Visit | Attending: Obstetrics & Gynecology | Admitting: Obstetrics & Gynecology

## 2023-12-01 DIAGNOSIS — Z1231 Encounter for screening mammogram for malignant neoplasm of breast: Secondary | ICD-10-CM

## 2023-12-26 ENCOUNTER — Ambulatory Visit: Admitting: Family Medicine

## 2023-12-26 VITALS — BP 130/88 | HR 89 | Temp 97.8°F | Ht 61.0 in | Wt 118.1 lb

## 2023-12-26 DIAGNOSIS — Z7689 Persons encountering health services in other specified circumstances: Secondary | ICD-10-CM

## 2023-12-26 DIAGNOSIS — E538 Deficiency of other specified B group vitamins: Secondary | ICD-10-CM | POA: Diagnosis not present

## 2023-12-26 DIAGNOSIS — Z780 Asymptomatic menopausal state: Secondary | ICD-10-CM

## 2023-12-26 DIAGNOSIS — E559 Vitamin D deficiency, unspecified: Secondary | ICD-10-CM | POA: Diagnosis not present

## 2023-12-26 DIAGNOSIS — R0989 Other specified symptoms and signs involving the circulatory and respiratory systems: Secondary | ICD-10-CM

## 2023-12-26 NOTE — Progress Notes (Signed)
 Patient Office Visit  Assessment & Plan:  Encounter to establish care -     CBC with Differential/Platelet -     Comprehensive metabolic panel with GFR -     Lipid panel -     TSH -     VITAMIN D  25 Hydroxy (Vit-D Deficiency, Fractures)  Postmenopausal -     DG Bone Density; Future  Vitamin B12 deficiency  Lymph node symptom -     Ambulatory referral to ENT   Assessment and Plan    Lump in neck, possible lymph node or cyst Lump present for a year, possibly lymph node or cyst, causing discomfort when swallowing. Differential includes lymph node or cyst, possibly related to drainage. - Order ENT referral for further evaluation.  Deficiency of vitamin B12 Recent diagnosis of vitamin B12 deficiency, started on sublingual B12 lozenges. - Continue sublingual B12 lozenges daily. - Monitor B12 levels in future visits.  Abnormal Pap smear, under surveillance per OB-GYN Consistent abnormal Pap smear results over last three tests. Colposcopy performed. Plan to hold off further intervention until next year as per OB GYN's advice. - Schedule Pap smear for next year.  Asymptomatic menopausal state on hormone replacement therapy On hormone replacement therapy, dosage adjusted due to high estrogen levels. - Continue current hormone replacement therapy dosage.  Hypertension, white coat syndrome White coat syndrome with normal home blood pressure readings. No antihypertensive medications prescribed. - Continue monitoring blood pressure at home. Weight  management- patient has normal BMI- has taken Phentermine  and Metformin  in the past per previous MD     Patient Declined flu shot and the Shingrix vaccine and pneumococcal vaccines today.  ENT consult ordered.  DEXA scan ordered at breast center in [place].  Return for physical next time. Test results were reviewed and analyzed as part of the medical decision making of this visit.  Previous notes and lab work done per Dr. Tonita.   Previous A1c's have been in normal range. Patient should not be taken Phentermine  and/or Metformin  at this time.  Recommend continuing healthy diet i.e mediterranean/DASH diet, consistent exercise - 30 minutes 5 day per week, and gradual weight loss. Return if symptoms worsen or fail to improve, for physical.   Subjective:    Patient ID: Leah Patrick, female    DOB: 1960-09-17  Age: 63 y.o. MRN: [id number]  Chief Complaint  Patient presents with   Medical Management of Chronic Issues   Establish Care    HPI Discussed the use of AI scribe software for clinical note transcription with the patient, who gave verbal consent to proceed.  History of Present Illness        Leah Patrick is a 63 year old female who presents for a follow-up visit regarding weight management and medication review.  She would like to establish primary care here.   She has been experiencing weight gain and is currently on metformin  and phentermine  for weight management prescribed by previous MD(has normal BMI). Patient has no previous history of prediabetes and has had normal A1C the past several years. Over the last two years, she has lost almost ten pounds and aims to lose an additional ten pounds to reach her usual weight of 108 pounds. She follows a metabolic therapy diet with no sugar or starch, influenced by her husband's prostate cancer treatment plan.  She has a history of abnormal Pap smears, with the last one being abnormal and followed by colposcopies. She plans to have another Pap smear  next year. She underwent a genetic screen which returned normal results.  She experiences anxiety primarily related to her husband's health but does not currently take medication for it. She previously used Wellbutrin  and Xanax  but discontinued them. She uses a bite guard for teeth grinding at night.  She has a lump in her neck, present for about a year, which causes discomfort when swallowing but no pain or  significant changes. She has not seen an ENT specialist for this issue yet. Patient did see Integrative medicine MD who told her she needed referral from PCP  Her family history includes her father having lung cancer and her brother having skin cancer. Her mother, who passed away at 75, did not have cancer. She reports experiencing 'white coat syndrome' affecting her blood pressure readings and states that her blood pressure is usually normal at home. She has not been told she has bone loss in the past but has positive FH for bone loss.   She exercises five days a week, including treadmill and weightlifting sessions, and follows a healthy diet rich in chicken, salmon, and vegetables. She takes vitamin D  and K2 supplements but stopped calcium  due to constipation. Her vitamin D  levels were previously high, and she recently started B12 lozenges for low B12 levels this past week Physical Exam Results LABS Genetic screen: Normal Estrogen: Elevated Vitamin D : Elevated B12: Deficient  RADIOLOGY Mammogram: Normal (12/05/2023)  PATHOLOGY Pap smear: Abnormal (09/2023) Assessment & Plan Lump in neck, possible lymph node or cyst Lump present for a year, possibly lymph node or cyst, causing discomfort when swallowing. Differential includes lymph node or cyst, possibly related to drainage. - Order ENT referral for further evaluation.  Deficiency of vitamin B12 Recent diagnosis of vitamin B12 deficiency, started on sublingual B12 lozenges. - Continue sublingual B12 lozenges daily. - Monitor B12 levels in future visits.  Abnormal Pap smear, under surveillance per OB-GYN Consistent abnormal Pap smear results over last three tests. Colposcopy performed. Plan to hold off further intervention until next year as per OB GYN's advice. - Schedule Pap smear for next year.  Asymptomatic menopausal state on hormone replacement therapy On hormone replacement therapy, dosage adjusted due to high estrogen  levels. - Continue current hormone replacement therapy dosage.  Hypertension, white coat syndrome White coat syndrome with normal home blood pressure readings. No antihypertensive medications prescribed. - Continue monitoring blood pressure at home. Weight  management- patient has normal BMI- has taken Phentermine  and Metformin  in the past per previous MD    The 10-year ASCVD risk score (Arnett DK, et al., 2019) is: 2.7%  Past Medical History:  Diagnosis Date   Allergy 1995   Seasonal   GERD (gastroesophageal reflux disease) 2010   Occasionally   Hypertension    Palpitations    Poor sleep hygiene    Past Surgical History:  Procedure Laterality Date   EYE SURGERY  1974   Amblyopia - lazy eye 8th grade   SQUINT REPAIR Right 04/19/1972   Social History   Tobacco Use   Smoking status: Never   Smokeless tobacco: Never  Substance Use Topics   Alcohol use: Yes    Alcohol/week: 3.0 standard drinks of alcohol    Types: 1 Glasses of wine, 1 Cans of beer, 1 Standard drinks or equivalent per week    Comment: Social drinks only - wine or beer with dinner out   Drug use: Never   Family History  Problem Relation Age of Onset   GER disease Mother  Varicose Veins Mother    COPD Father    Lung cancer Father    Heart disease Sister    Obesity Sister    Varicose Veins Sister    Stroke Sister    Varicose Veins Sister    Cancer Brother    Heart disease Brother    Breast cancer Neg Hx    Uterine cancer Neg Hx    Colon cancer Neg Hx    Ovarian cancer Neg Hx    Allergies  Allergen Reactions   Dtap-Ipv Vaccine Hives   Tetanus Antitoxin Hives   Flexeril [Cyclobenzaprine]     ROS    Objective:    BP 130/88   Pulse 89   Temp 97.8 F (36.6 C)   Ht 5' 1 (1.549 m)   Wt 118 lb 2 oz (53.6 kg)   SpO2 99%   BMI 22.32 kg/m  BP Readings from Last 3 Encounters:  12/26/23 130/88  06/29/22 138/80  06/24/21 126/82   Wt Readings from Last 3 Encounters:  12/26/23 118 lb 2  oz (53.6 kg)  06/29/22 124 lb 3.2 oz (56.3 kg)  06/24/21 127 lb 12.8 oz (58 kg)    Physical Exam Vitals and nursing note reviewed.  Constitutional:      General: She is not in acute distress.    Appearance: Normal appearance.  HENT:     Head: Normocephalic.     Right Ear: Tympanic membrane, ear canal and external ear normal.     Left Ear: Tympanic membrane, ear canal and external ear normal.  Eyes:     Extraocular Movements: Extraocular movements intact.     Conjunctiva/sclera: Conjunctivae normal.     Pupils: Pupils are equal, round, and reactive to light.  Neck:     Comments: Left sided 1.5 slightly tender Lymph node Cardiovascular:     Rate and Rhythm: Normal rate and regular rhythm.     Heart sounds: Normal heart sounds.  Pulmonary:     Effort: Pulmonary effort is normal.     Breath sounds: Normal breath sounds. No wheezing.  Musculoskeletal:     Right lower leg: No edema.     Left lower leg: No edema.  Lymphadenopathy:     Cervical: Cervical adenopathy present.  Neurological:     General: No focal deficit present.     Mental Status: She is alert and oriented to person, place, and time.  Psychiatric:        Mood and Affect: Mood normal.        Behavior: Behavior normal.        Thought Content: Thought content normal.        Judgment: Judgment normal.       No results found for any visits on 12/26/23.

## 2023-12-27 ENCOUNTER — Other Ambulatory Visit: Payer: Self-pay

## 2023-12-27 ENCOUNTER — Encounter (INDEPENDENT_AMBULATORY_CARE_PROVIDER_SITE_OTHER): Payer: Self-pay

## 2023-12-27 ENCOUNTER — Ambulatory Visit: Payer: Self-pay | Admitting: Family Medicine

## 2023-12-27 DIAGNOSIS — Z136 Encounter for screening for cardiovascular disorders: Secondary | ICD-10-CM

## 2023-12-27 DIAGNOSIS — M79604 Pain in right leg: Secondary | ICD-10-CM

## 2023-12-27 LAB — CBC WITH DIFFERENTIAL/PLATELET
Absolute Lymphocytes: 1892 {cells}/uL (ref 850–3900)
Absolute Monocytes: 633 {cells}/uL (ref 200–950)
Basophils Absolute: 29 {cells}/uL (ref 0–200)
Basophils Relative: 0.5 %
Eosinophils Absolute: 80 {cells}/uL (ref 15–500)
Eosinophils Relative: 1.4 %
HCT: 40.2 % (ref 35.0–45.0)
Hemoglobin: 13.3 g/dL (ref 11.7–15.5)
MCH: 30.9 pg (ref 27.0–33.0)
MCHC: 33.1 g/dL (ref 32.0–36.0)
MCV: 93.3 fL (ref 80.0–100.0)
MPV: 9.3 fL (ref 7.5–12.5)
Monocytes Relative: 11.1 %
Neutro Abs: 3067 {cells}/uL (ref 1500–7800)
Neutrophils Relative %: 53.8 %
Platelets: 242 Thousand/uL (ref 140–400)
RBC: 4.31 Million/uL (ref 3.80–5.10)
RDW: 12.3 % (ref 11.0–15.0)
Total Lymphocyte: 33.2 %
WBC: 5.7 Thousand/uL (ref 3.8–10.8)

## 2023-12-27 LAB — COMPREHENSIVE METABOLIC PANEL WITH GFR
AG Ratio: 2.3 (calc) (ref 1.0–2.5)
ALT: 17 U/L (ref 6–29)
AST: 24 U/L (ref 10–35)
Albumin: 4.5 g/dL (ref 3.6–5.1)
Alkaline phosphatase (APISO): 41 U/L (ref 37–153)
BUN: 15 mg/dL (ref 7–25)
CO2: 27 mmol/L (ref 20–32)
Calcium: 9.4 mg/dL (ref 8.6–10.4)
Chloride: 103 mmol/L (ref 98–110)
Creat: 0.71 mg/dL (ref 0.50–1.05)
Globulin: 2 g/dL (ref 1.9–3.7)
Glucose, Bld: 85 mg/dL (ref 65–99)
Potassium: 4.2 mmol/L (ref 3.5–5.3)
Sodium: 138 mmol/L (ref 135–146)
Total Bilirubin: 0.4 mg/dL (ref 0.2–1.2)
Total Protein: 6.5 g/dL (ref 6.1–8.1)
eGFR: 97 mL/min/1.73m2 (ref >=60)

## 2023-12-27 LAB — LIPID PANEL
Cholesterol: 188 mg/dL (ref <200)
HDL: 87 mg/dL (ref >=50)
LDL Cholesterol (Calc): 87 mg/dL
Non-HDL Cholesterol (Calc): 101 mg/dL (ref <130)
Total CHOL/HDL Ratio: 2.2 (calc) (ref <5.0)
Triglycerides: 57 mg/dL (ref <150)

## 2023-12-27 LAB — TSH: TSH: 1.43 m[IU]/L (ref 0.40–4.50)

## 2023-12-27 LAB — VITAMIN D 25 HYDROXY (VIT D DEFICIENCY, FRACTURES): Vit D, 25-Hydroxy: 92 ng/mL (ref 30–100)

## 2023-12-27 MED ORDER — FEXOFENADINE HCL 180 MG PO TABS: 180.0000 mg | ORAL_TABLET | Freq: Every day | ORAL | 1 refills | Status: DC

## 2024-01-17 ENCOUNTER — Ambulatory Visit: Payer: Self-pay | Admitting: Family Medicine

## 2024-01-17 ENCOUNTER — Other Ambulatory Visit: Payer: Self-pay | Admitting: Podiatry

## 2024-01-17 ENCOUNTER — Ambulatory Visit (HOSPITAL_BASED_OUTPATIENT_CLINIC_OR_DEPARTMENT_OTHER)
Admission: RE | Admit: 2024-01-17 | Discharge: 2024-01-17 | Disposition: A | Discharge status: Home / Self Care | Payer: Self-pay | Source: Ambulatory Visit | Attending: Family Medicine | Admitting: Family Medicine

## 2024-01-17 DIAGNOSIS — Z136 Encounter for screening for cardiovascular disorders: Secondary | ICD-10-CM | POA: Insufficient documentation

## 2024-01-17 DIAGNOSIS — M2042 Other hammer toe(s) (acquired), left foot: Secondary | ICD-10-CM | POA: Diagnosis not present

## 2024-01-17 DIAGNOSIS — M2012 Hallux valgus (acquired), left foot: Secondary | ICD-10-CM | POA: Diagnosis not present

## 2024-01-27 ENCOUNTER — Encounter: Payer: Self-pay | Admitting: Podiatry

## 2024-01-27 NOTE — Anesthesia Preprocedure Evaluation (Signed)
 Anesthesia Evaluation    Airway        Dental   Pulmonary           Cardiovascular hypertension,      Neuro/Psych    GI/Hepatic   Endo/Other    Renal/GU      Musculoskeletal   Abdominal   Peds  Hematology   Anesthesia Other Findings Medical History Hypertension  GERD (gastroesophageal reflux disease) Palpitations  Poor sleep hygiene Allergy  Complication of anesthesia--is cold and has rigors on awakening from anesthesia Pre-diabetes      Reproductive/Obstetrics                              Anesthesia Physical Anesthesia Plan Anesthesia Quick Evaluation

## 2024-01-30 NOTE — Discharge Instructions (Signed)

## 2024-02-01 ENCOUNTER — Encounter: Payer: Self-pay | Admitting: Anesthesiology

## 2024-02-01 ENCOUNTER — Ambulatory Visit: Admission: RE | Admit: 2024-02-01 | Source: Home / Self Care | Admitting: Podiatry

## 2024-02-01 ENCOUNTER — Encounter: Admission: RE | Payer: Self-pay | Source: Home / Self Care

## 2024-02-01 HISTORY — DX: Other complications of anesthesia, initial encounter: T88.59XA

## 2024-02-01 HISTORY — DX: Prediabetes: R73.03

## 2024-02-01 SURGERY — BUNIONECTOMY, LAPIDUS
Anesthesia: Choice | Site: Fifth Toe | Laterality: Left

## 2024-02-20 ENCOUNTER — Other Ambulatory Visit (HOSPITAL_COMMUNITY): Payer: Self-pay | Admitting: *Deleted

## 2024-02-20 ENCOUNTER — Encounter (INDEPENDENT_AMBULATORY_CARE_PROVIDER_SITE_OTHER): Payer: Self-pay | Admitting: Otolaryngology

## 2024-02-20 ENCOUNTER — Ambulatory Visit (INDEPENDENT_AMBULATORY_CARE_PROVIDER_SITE_OTHER): Admitting: Otolaryngology

## 2024-02-20 VITALS — BP 140/83 | HR 83 | Ht 61.0 in

## 2024-02-20 DIAGNOSIS — R221 Localized swelling, mass and lump, neck: Secondary | ICD-10-CM | POA: Diagnosis not present

## 2024-02-20 DIAGNOSIS — R1310 Dysphagia, unspecified: Secondary | ICD-10-CM

## 2024-02-20 DIAGNOSIS — R1319 Other dysphagia: Secondary | ICD-10-CM | POA: Diagnosis not present

## 2024-02-20 DIAGNOSIS — H9313 Tinnitus, bilateral: Secondary | ICD-10-CM | POA: Diagnosis not present

## 2024-02-20 DIAGNOSIS — H9190 Unspecified hearing loss, unspecified ear: Secondary | ICD-10-CM

## 2024-02-20 NOTE — Progress Notes (Signed)
 Dear Dr. Aletha, Here is my assessment for our mutual patient, Chene Kasinger. Thank you for allowing me the opportunity to care for your patient. Please do not hesitate to contact me should you have any other questions. Sincerely, Dr. Eldora Blanch  Otolaryngology Clinic Note Referring provider: Dr. Aletha HPI:  Initial visit (02/2024): Discussed the use of AI scribe software for clinical note transcription with the patient, who gave verbal consent to proceed.  History of Present Illness Leah Patrick is a 63 year old female who presents with a lump on her neck and swallowing difficulties.  She has had a painless lump on the left side of her neck for about a year, with constant drainage but no current pain. Has not changed in size. She reports that she can feel the lump pulsating. There all the time, nothing makes it better or worse. She also has not experiences difficulty swallowing, slowly worsening, especially with solid foods, without weight loss or choking. Occasional reflux occurs but does not require medication.  She denies pneumonia, bloody cough, or significant shortness of breath. She experiences tinnitus and occasional shooting pain in her ears, with the left ear being worse. No ear infections, drainage, vertigo. She has a history of noise exposure from concerts and is interested in a hearing test. No recent hearing test  She denies smoking, significant alcohol use, and is not on blood thinning medications. There are no bleeding problems or anesthesia issues.  Patient otherwise denies: - odynophagia, aspiration episodes or PNA, need for Heimlich, unintentional weight loss, changes in voice   H&N Surgery: Blepharoplasty, Nasopalatine Duct Cyst excision (excised by OMFS?) Personal or FHx of bleeding dz or anesthesia difficulty: no  AP/AC: no  Tobacco: no. Alcohol: no  PMHx: Wt gain, GAD  Independent Review of Additional Tests or Records:  Dr. Aletha (12/26/2023): noted lump  in neck, possible lymph node or cyst -- present for at least a year, discomfort with swallowing; Dx: Neck mass; Rx: ref to ENT Labs CBC and CMP 12/26/2023: BUN/Cr 15/0.71, WBC 5.7, Plt 242  PMH/Meds/All/SocHx/FamHx/ROS:   Past Medical History:  Diagnosis Date   Allergy 1995   Seasonal   Complication of anesthesia    gets cold and the shakes when waking up   GERD (gastroesophageal reflux disease) 2010   Occasionally   Hypertension    white coat syndrome   Palpitations    Poor sleep hygiene    Pre-diabetes    diet controlled meds for weight loss only     Past Surgical History:  Procedure Laterality Date   DENTAL SURGERY N/A    EYE SURGERY  1974   Amblyopia - lazy eye 8th grade   SQUINT REPAIR Right 04/19/1972    Family History  Problem Relation Age of Onset   GER disease Mother    Varicose Veins Mother    COPD Father    Lung cancer Father    Heart disease Sister    Obesity Sister    Varicose Veins Sister    Stroke Sister    Varicose Veins Sister    Cancer Brother    Heart disease Brother    Breast cancer Neg Hx    Uterine cancer Neg Hx    Colon cancer Neg Hx    Ovarian cancer Neg Hx      Social Connections: Moderately Integrated (12/26/2023)   Social Connection and Isolation Panel    Frequency of Communication with Friends and Family: More than three times a week  Frequency of Social Gatherings with Friends and Family: Once a week    Attends Religious Services: More than 4 times per year    Active Member of [employer] or Organizations: No    Attends Engineer, Structural: Not on file    Marital Status: Living with partner      Current Outpatient Medications:    Ascorbic Acid (VITAMIN C) 1000 MG tablet, Take 1,000 mg by mouth daily., Disp: , Rfl:    fexofenadine  (ALLEGRA ) 180 MG tablet, Take 1 tablet (180 mg total) by mouth daily., Disp: 90 tablet, Rfl: 1   levocetirizine (XYZAL ) 5 MG tablet, TAKE 1 TABLET BY MOUTH DAILY FOR ALLERGIES, Disp: 90 tablet, Rfl:  3   metFORMIN  (GLUCOPHAGE -XR) 500 MG 24 hr tablet, Take 2 tablets 2 x /day with Meals for Diabetes., Disp: 360 tablet, Rfl: 3   Multiple Vitamins-Minerals (MULTIVITAMIN WOMEN PO), Take by mouth daily., Disp: , Rfl:    phentermine  (ADIPEX-P ) 37.5 MG tablet, Take 1/2 to 1 tablet every Morninng for Dieting & Weight Loss                                                                    /                                                                   TAKE                                         BY                                                 MOUTH, Disp: 90 tablet, Rfl: 0   progesterone  (PROMETRIUM ) 100 MG capsule, Take by mouth., Disp: , Rfl:    testosterone (ANDROGEL) 50 MG/5GM (1%) GEL, Place 5 g onto the skin daily., Disp: , Rfl:    tretinoin  (RETIN-A ) 0.1 % cream, Apply at bedtime topically., Disp: 45 g, Rfl: 3   TURMERIC CURCUMIN PO, Take by mouth daily., Disp: , Rfl:    Vitamin D -Vitamin K (VITAMIN K2-VITAMIN D3 PO), Take by mouth daily., Disp: , Rfl:    DIVIGEL  1 MG/GM GEL, Apply daily (Patient not taking: Reported on 02/20/2024), Disp: 90 g, Rfl: 99   Physical Exam:   BP (!) 140/83   Pulse 83   Ht 5' 1 (1.549 m)   SpO2 95%   BMI 22.67 kg/m   Salient findings:  CN II-XII intact Bilateral EAC clear and TM intact with well pneumatized middle ear spaces Anterior rhinoscopy: Septum intact; bilateral inferior turbinates without significant hypertrophy No lesions of oral cavity/oropharynx No obviously palpable neck masses/lymphadenopathy/thyromegaly --- area she points to appears to be her pulsating carotid, which is easily palpable given very thin neck; no  appreciable lymphadenopathy  in neck No respiratory distress or stridor; easily tolerating secretions; TFL was indicated to better evaluate the proximal airway, given the patient's history and exam findings, and is detailed below.   Seprately Identifiable Procedures:  Prior to initiating any procedures, risks/benefits/alternatives  were explained to the patient and verbal consent obtained. Procedure Note Pre-procedure diagnosis:  Dysphagia Post-procedure diagnosis: Same Procedure: Transnasal Fiberoptic Laryngoscopy, CPT 31575 - Mod 25 Indication: see above Complications: None apparent EBL: 0 mL  The procedure was undertaken to further evaluate the patient's complaint above, with mirror exam inadequate for appropriate examination due to gag reflex and poor patient tolerance  Procedure:  Patient was identified as correct patient. Verbal consent was obtained. The nose was sprayed with oxymetazoline and 4% lidocaine. The The flexible laryngoscope was passed through the nose to view the nasal cavity, pharynx (oropharynx, hypopharynx) and larynx.  The larynx was examined at rest and during multiple phonatory tasks. Documentation was obtained and reviewed with patient. The scope was removed. The patient tolerated the procedure well.  Findings: The nasal cavity and nasopharynx did not reveal any masses or lesions, mucosa appeared to be without obvious lesions. The tongue base, pharyngeal walls, piriform sinuses, vallecula, epiglottis and postcricoid region are normal in appearance without noted masses. The visualized portion of the subglottis and proximal trachea is widely patent. The vocal folds are mobile bilaterally. There are no lesions on the free edge of the vocal folds nor elsewhere in the larynx worrisome for malignancy.    Electronically signed by: Eldora KATHEE Blanch, MD 02/20/2024 9:57 AM   Impression & Plans:  Labrittany Wechter is a 63 y.o. female with:  1. Other dysphagia   2. Neck mass   3. Tinnitus of both ears   4. Subjective hearing loss    Noted neck mass appears to be just her carotid on left, which is slightly more prominent than right. Otherwise asymptomatic, and she was relieved about this. Thin neck and given exam is straightforward, will observe  Dysphagia: will get MBS and esophagram given worsening Tinnitus  and subjective hearing loss: suspect presbycusis, will get audio  - f/u 8 weeks  See below regarding exact medications prescribed this encounter including dosages and route: No orders of the defined types were placed in this encounter.     Thank you for allowing me the opportunity to care for your patient. Please do not hesitate to contact me should you have any other questions.  Sincerely, Eldora Blanch, MD Otolaryngologist (ENT), [facility] Phone: [phone] Fax: [phone]  02/20/2024, 9:57 AM   MDM:  Level 4 - [phone] Complexity/Problems addressed: mod - multiple chronic problems Data complexity: mod - independent review of note, labs, ordering test - Morbidity: low  - Prescription Drug prescribed or managed: n

## 2024-02-20 NOTE — Patient Instructions (Signed)
 I have ordered an imaging study for you to complete prior to your next visit. Please call [facility] Scheduling at [phone] to schedule your imaging if you have not received a call within 24 hours. If you are unable to complete your imaging study prior to your next scheduled visit please call our office to let us  know.

## 2024-03-02 ENCOUNTER — Ambulatory Visit (HOSPITAL_COMMUNITY)
Admission: RE | Admit: 2024-03-02 | Discharge: 2024-03-02 | Disposition: A | Discharge status: Home / Self Care | Source: Ambulatory Visit | Attending: Family Medicine | Admitting: Family Medicine

## 2024-03-02 ENCOUNTER — Ambulatory Visit (HOSPITAL_COMMUNITY)
Admission: RE | Admit: 2024-03-02 | Discharge: 2024-03-02 | Disposition: A | Discharge status: Home / Self Care | Source: Ambulatory Visit | Attending: Otolaryngology | Admitting: Otolaryngology

## 2024-03-02 ENCOUNTER — Other Ambulatory Visit (INDEPENDENT_AMBULATORY_CARE_PROVIDER_SITE_OTHER): Payer: Self-pay | Admitting: Otolaryngology

## 2024-03-02 DIAGNOSIS — H9190 Unspecified hearing loss, unspecified ear: Secondary | ICD-10-CM

## 2024-03-02 DIAGNOSIS — R221 Localized swelling, mass and lump, neck: Secondary | ICD-10-CM

## 2024-03-02 DIAGNOSIS — R1319 Other dysphagia: Secondary | ICD-10-CM

## 2024-03-02 DIAGNOSIS — R1310 Dysphagia, unspecified: Secondary | ICD-10-CM

## 2024-03-02 DIAGNOSIS — K449 Diaphragmatic hernia without obstruction or gangrene: Secondary | ICD-10-CM | POA: Diagnosis not present

## 2024-03-02 DIAGNOSIS — H9313 Tinnitus, bilateral: Secondary | ICD-10-CM

## 2024-03-02 NOTE — Progress Notes (Signed)
 Modified Barium Swallow Study  Patient Details  Name: Leah Patrick MRN: [id number] Date of Birth: Feb 04, 1961  Today's Date: 03/02/2024  Modified Barium Swallow completed.  Full report located under Chart Review in the Imaging Section.  History of Present Illness 63 year old female with benign neck mass (left carotid), monitored by ENT, referred for MBS due to increased difficulty swallowing, primarily solids without weight loss or choking. Occassional reflux but does not require medication per ENT notes. Recent laryngoscopy 02/20/24 negative for acute findings.   Clinical Impression (P) Patient presents with a normal oropharyngeal swallow. No aspiration observed. No significant esophageal deficits noted. She does have the appearance of small osteophytes along cervical spine however these did not impede the clearance of bolus through the upper esophagus. Symptoms appear more in line with an esophageal deficit. Patient to have an esophagram after today's study. No SLP f/u indicated. Factors that may increase risk of adverse event in presence of aspiration [name] & [name] 2021):    Swallow Evaluation Recommendations Recommendations: (P) PO diet PO Diet Recommendation: (P) Regular;Thin liquids (Level 0) Liquid Administration via: (P) Cup;Straw Medication Administration: (P) Whole meds with liquid Supervision: (P) Patient able to self-feed Postural changes: (P) Position pt fully upright for meals;Stay upright 30-60 min after meals Oral care recommendations: (P) Oral care BID (2x/day) Recommended consults: (P) Consider GI consultation (pending results of esophagram)      [name] [name] 03/02/2024,1:31 PM

## 2024-03-05 ENCOUNTER — Ambulatory Visit: Admitting: Family Medicine

## 2024-03-05 ENCOUNTER — Ambulatory Visit
Admission: RE | Admit: 2024-03-05 | Discharge: 2024-03-05 | Disposition: A | Source: Ambulatory Visit | Attending: Family Medicine

## 2024-03-05 VITALS — BP 127/72 | HR 72 | Temp 97.6°F | Ht 61.0 in | Wt 124.0 lb

## 2024-03-05 DIAGNOSIS — R059 Cough, unspecified: Secondary | ICD-10-CM | POA: Diagnosis not present

## 2024-03-05 DIAGNOSIS — J069 Acute upper respiratory infection, unspecified: Secondary | ICD-10-CM | POA: Diagnosis not present

## 2024-03-05 MED ORDER — AMOXICILLIN-POT CLAVULANATE 875-125 MG PO TABS: 1.0000 | ORAL_TABLET | Freq: Two times a day (BID) | ORAL | 0 refills | Status: DC

## 2024-03-05 NOTE — Progress Notes (Signed)
 Acute Office Visit  Patient ID: Leah Patrick, female    DOB: Dec 28, 1960, 63 y.o.   MRN: [id number]  PCP: Aletha Bene, MD  Chief Complaint  Patient presents with   Acute Visit    Chest Congestion, cough, ears stopped up. Sore lymph nodes under ears, sore throat x 1 week      Subjective:     HPI  Discussed the use of AI scribe software for clinical note transcription with the patient, who gave verbal consent to proceed.  History of Present Illness Leah Patrick is a 63 year old female who presents with chest and sinus congestion.  She has been experiencing chest and sinus congestion since last Monday. Initially, she felt poorly and began taking Mucinex D, but her symptoms worsened. She had a fever at the beginning, which has since subsided over the past couple of days. The congestion has moved into her chest, and she describes her cough as sounding terrible, something she has never experienced before. She occasionally experiences wheezing at night and has been taking Nyquil. She ran out of Mucinex D yesterday.  She mentions visiting an ENT for a diagnostic procedure and suspects she may have picked up an illness there despite practicing hand hygiene. She reports ear pain and fullness, particularly on the left side, which makes her feel 'stuffed up' and affects her hearing. Her husband has noticed her hearing difficulties. No significant throat pain but notes some soreness and enlarged lymph nodes. She has experienced headaches and facial pain but no dental pain. She has a runny nose with watery discharge, although her cough produces yellow sputum. Her sinus symptoms have improved modestly.   Review of Systems  All other systems reviewed and are negative.   Past Medical History:  Diagnosis Date   Allergy 1995   Seasonal   Complication of anesthesia    gets cold and the shakes when waking up   GERD (gastroesophageal reflux disease) 2010   Occasionally    Hypertension    white coat syndrome   Palpitations    Poor sleep hygiene    Pre-diabetes    diet controlled meds for weight loss only    Past Surgical History:  Procedure Laterality Date   DENTAL SURGERY N/A    EYE SURGERY  1974   Amblyopia - lazy eye 8th grade   SQUINT REPAIR Right 04/19/1972    Current Outpatient Medications on File Prior to Visit  Medication Sig Dispense Refill   Ascorbic Acid (VITAMIN C) 1000 MG tablet Take 1,000 mg by mouth daily.     DIVIGEL  1 MG/GM GEL Apply daily 90 g 99   metFORMIN  (GLUCOPHAGE -XR) 500 MG 24 hr tablet Take 2 tablets 2 x /day with Meals for Diabetes. 360 tablet 3   Multiple Vitamins-Minerals (MULTIVITAMIN WOMEN PO) Take by mouth daily.     phentermine  (ADIPEX-P ) 37.5 MG tablet Take 1/2 to 1 tablet every Morninng for Dieting & Weight Loss                                                                    /  TAKE                                         BY                                                 MOUTH 90 tablet 0   progesterone  (PROMETRIUM ) 100 MG capsule Take by mouth.     testosterone (ANDROGEL) 50 MG/5GM (1%) GEL Place 5 g onto the skin daily.     tretinoin  (RETIN-A ) 0.1 % cream Apply at bedtime topically. 45 g 3   TURMERIC CURCUMIN PO Take by mouth daily.     Vitamin D -Vitamin K (VITAMIN K2-VITAMIN D3 PO) Take by mouth daily.     levocetirizine (XYZAL ) 5 MG tablet TAKE 1 TABLET BY MOUTH DAILY FOR ALLERGIES (Patient not taking: Reported on 03/05/2024) 90 tablet 3   No current facility-administered medications on file prior to visit.    Allergies  Allergen Reactions   Dtap-Ipv Vaccine Hives   Tetanus Antitoxin Hives   Flexeril [Cyclobenzaprine]        Objective:    BP 127/72   Pulse 72   Temp 97.6 F (36.4 C)   Ht 5' 1 (1.549 m)   Wt 124 lb (56.2 kg)   SpO2 96%   BMI 23.43 kg/m    Physical Exam Vitals and nursing note reviewed.  Constitutional:       Appearance: Normal appearance. She is normal weight.  HENT:     Head: Normocephalic and atraumatic.     Right Ear: Tympanic membrane, ear canal and external ear normal.     Left Ear: Tympanic membrane, ear canal and external ear normal.     Nose: Congestion present.     Right Sinus: Maxillary sinus tenderness and frontal sinus tenderness present.     Left Sinus: Maxillary sinus tenderness and frontal sinus tenderness present.     Mouth/Throat:     Mouth: Mucous membranes are moist.     Pharynx: Oropharynx is clear.  Eyes:     Extraocular Movements: Extraocular movements intact.     Conjunctiva/sclera: Conjunctivae normal.  Cardiovascular:     Rate and Rhythm: Normal rate and regular rhythm.     Pulses: Normal pulses.     Heart sounds: Normal heart sounds.  Pulmonary:     Effort: Pulmonary effort is normal. No respiratory distress.     Breath sounds: Rhonchi present.  Musculoskeletal:     Cervical back: Tenderness present.  Lymphadenopathy:     Cervical: Cervical adenopathy present.  Skin:    General: Skin is warm and dry.  Neurological:     General: No focal deficit present.     Mental Status: She is alert and oriented to person, place, and time. Mental status is at baseline.  Psychiatric:        Mood and Affect: Mood normal.        Behavior: Behavior normal.        Thought Content: Thought content normal.        Judgment: Judgment normal.       No results found for any visits on 03/05/24.     Assessment & Plan:   Problem List Items Addressed This Visit     Upper respiratory  tract infection - Primary   Relevant Orders   DG Chest 2 View    Assessment and Plan Assessment & Plan Acute upper respiratory infection Symptoms suggest possible bacterial infection with chest congestion and abnormal lung sounds. - Prescribed Augmentin  875-125mg  BID x5d - Ordered chest x-ray. - Advised continuation of Mucinex and OTC symptomatic management    Meds ordered this  encounter  Medications   amoxicillin -clavulanate (AUGMENTIN ) 875-125 MG tablet    Sig: Take 1 tablet by mouth 2 (two) times daily.    Dispense:  10 tablet    Refill:  0    Supervising Provider:   DUANNE LOWERS T [[id number]]    Return if symptoms worsen or fail to improve.  Jeoffrey GORMAN Barrio, FNP [facility]

## 2024-03-07 ENCOUNTER — Ambulatory Visit: Payer: Self-pay | Admitting: Family Medicine

## 2024-03-26 ENCOUNTER — Other Ambulatory Visit: Payer: Self-pay

## 2024-04-02 ENCOUNTER — Ambulatory Visit (INDEPENDENT_AMBULATORY_CARE_PROVIDER_SITE_OTHER): Admitting: Audiology

## 2024-04-02 DIAGNOSIS — H903 Sensorineural hearing loss, bilateral: Secondary | ICD-10-CM

## 2024-04-02 NOTE — Progress Notes (Signed)
°  [address] [place], [place] [address] [phone]  Audiological Evaluation    Name: Leah Patrick     DOB:   05-08-1960      MRN:   [id number]                                                                                     Service Date: 04/02/2024     Accompanied by: husband   Patient comes today after Dr. Tobie, ENT sent a referral for a hearing evaluation due to concerns with hearing loss.   Symptoms Yes Details  Hearing loss  [x]  Hearing loss for years, mostly in the left ear  Tinnitus  [x]   Both ears  Ear pain/ infections/pressure  []    Balance problems  [x]  Always has had car sickness  Noise exposure history  []    Previous ear surgeries  []    Family history of hearing loss  []    Amplification  []    Other  []      Otoscopy: Right ear: Clear external ear canal and notable landmarks visualized on the tympanic membrane. Left ear:  Clear external ear canal and notable landmarks visualized on the tympanic membrane.  Tympanometry: Right ear: Type A - Normal external ear canal volume with normal middle ear pressure and normal tympanic membrane compliance. Findings are consistent with normal middle ear function. Left ear: Type A - Normal external ear canal volume with normal middle ear pressure and normal tympanic membrane compliance. Findings are consistent with normal middle ear function.  Hearing Evaluation The hearing test results were completed under headphones and results are deemed to be of good reliability. Test technique:  conventional    Pure tone Audiometry: Right ear- Normal to moderate sensorineural hearing loss from 250 Hz - 8000 Hz. Left ear-  Normal to moderately severe sensorineural hearing loss from 250 Hz - 8000 Hz.  Speech Audiometry: Right ear- Speech Reception Threshold (SRT) was obtained at 35 dBHL. Left ear-Speech Reception Threshold (SRT) was obtained at 30 dBHL.   Word Recognition Score Tested using NU-6 (recorded) Right ear:  96% was obtained at a presentation level of 75 dBHL with contralateral masking which is deemed as  excellent. Left ear: 100% was obtained at a presentation level of 75 dBHL with contralateral masking which is deemed as  excellent.   Impression: There is not a significant difference in pure-tone thresholds between ears., There is not a significant difference in the word recognition score in between ears.    Recommendations: Follow up with ENT as scheduled. Return for a hearing evaluation if concerns with hearing changes arise or per MD recommendation. Consider a communication needs assessment for amplification after medical clearance is obtained, if needed.   Leah Patrick, AUD

## 2024-04-03 ENCOUNTER — Encounter (INDEPENDENT_AMBULATORY_CARE_PROVIDER_SITE_OTHER): Payer: Self-pay | Admitting: Otolaryngology

## 2024-04-03 ENCOUNTER — Ambulatory Visit (INDEPENDENT_AMBULATORY_CARE_PROVIDER_SITE_OTHER): Admitting: Otolaryngology

## 2024-04-03 VITALS — BP 146/89 | HR 74 | Ht 61.0 in

## 2024-04-03 DIAGNOSIS — H903 Sensorineural hearing loss, bilateral: Secondary | ICD-10-CM | POA: Diagnosis not present

## 2024-04-03 DIAGNOSIS — R1319 Other dysphagia: Secondary | ICD-10-CM | POA: Diagnosis not present

## 2024-04-03 DIAGNOSIS — H9313 Tinnitus, bilateral: Secondary | ICD-10-CM

## 2024-04-03 NOTE — Progress Notes (Signed)
 Dear Dr. Aletha, Here is my assessment for our mutual patient, Leah Patrick. Thank you for allowing me the opportunity to care for your patient. Please do not hesitate to contact me should you have any other questions. Sincerely, Dr. Eldora Blanch  Otolaryngology Clinic Note Referring provider: Dr. Aletha HPI:  Initial visit (02/2024): Discussed the use of AI scribe software for clinical note transcription with the patient, who gave verbal consent to proceed.  History of Present Illness Leah Patrick is a 63 year old female who presents with a lump on her neck and swallowing difficulties.  She has had a painless lump on the left side of her neck for about a year, with constant drainage but no current pain. Has not changed in size. She reports that she can feel the lump pulsating. There all the time, nothing makes it better or worse. She also has not experiences difficulty swallowing, slowly worsening, especially with solid foods, without weight loss or choking. Occasional reflux occurs but does not require medication.  She denies pneumonia, bloody cough, or significant shortness of breath. She experiences tinnitus and occasional shooting pain in her ears, with the left ear being worse. No ear infections, drainage, vertigo. She has a history of noise exposure from concerts and is interested in a hearing test. No recent hearing test  She denies smoking, significant alcohol use, and is not on blood thinning medications. There are no bleeding problems or anesthesia issues.  Patient otherwise denies: - odynophagia, aspiration episodes or PNA, need for Heimlich, unintentional weight loss, changes in voice  --------------------------------------------------------- 04/03/2024 Returns for f/u. Did have audio, esophagram and MBS. We discussed the results.    H&N Surgery: Blepharoplasty, Nasopalatine Duct Cyst excision (excised by OMFS?) Personal or FHx of bleeding dz or anesthesia difficulty:  no  AP/AC: no  Tobacco: no. Alcohol: no  PMHx: GAD  Independent Review of Additional Tests or Records:  Dr. Aletha (12/26/2023): noted lump in neck, possible lymph node or cyst -- present for at least a year, discomfort with swallowing; Dx: Neck mass; Rx: ref to ENT Labs CBC and CMP 12/26/2023: BUN/Cr 15/0.71, WBC 5.7, Plt 242 03/2024 Audiogram was independently reviewed and interpreted by me and it reveals - A/A tymps;  Right ear- Normal to moderate sensorineural hearing loss from 250 Hz - 8000 Hz.; Left ear-  Normal to moderately severe sensorineural hearing loss from 250 Hz - 8000 Hz. WRT >96% AU at 75dB HL  MBS and Esophagram 03/02/2024 reviewed: no evidence of aspiration; UES patent, small hiatal hernia but no noted significant dysmotility.    PMH/Meds/All/SocHx/FamHx/ROS:   Past Medical History:  Diagnosis Date   Allergy 1995   Seasonal   Complication of anesthesia    gets cold and the shakes when waking up   GERD (gastroesophageal reflux disease) 2010   Occasionally   Hypertension    white coat syndrome   Palpitations    Poor sleep hygiene    Pre-diabetes    diet controlled meds for weight loss only     Past Surgical History:  Procedure Laterality Date   DENTAL SURGERY N/A    EYE SURGERY  1974   Amblyopia - lazy eye 8th grade   SQUINT REPAIR Right 04/19/1972    Family History  Problem Relation Age of Onset   GER disease Mother    Varicose Veins Mother    COPD Father    Lung cancer Father    Cancer Father    Heart disease Sister  Obesity Sister    Varicose Veins Sister    Stroke Sister    Varicose Veins Sister    Cancer Brother    Heart disease Brother    Breast cancer Neg Hx    Uterine cancer Neg Hx    Colon cancer Neg Hx    Ovarian cancer Neg Hx      Social Connections: Moderately Integrated (03/05/2024)   Social Connection and Isolation Panel    Frequency of Communication with Friends and Family: Three times a week    Frequency of Social  Gatherings with Friends and Family: Once a week    Attends Religious Services: More than 4 times per year    Active Member of [employer] or Organizations: No    Attends Engineer, Structural: Not on file    Marital Status: Living with partner      Current Outpatient Medications:    amoxicillin -clavulanate (AUGMENTIN ) 875-125 MG tablet, Take 1 tablet by mouth 2 (two) times daily., Disp: 10 tablet, Rfl: 0   Ascorbic Acid (VITAMIN C) 1000 MG tablet, Take 1,000 mg by mouth daily., Disp: , Rfl:    DIVIGEL  1 MG/GM GEL, Apply daily, Disp: 90 g, Rfl: 99   metFORMIN  (GLUCOPHAGE -XR) 500 MG 24 hr tablet, Take 2 tablets 2 x /day with Meals for Diabetes., Disp: 360 tablet, Rfl: 3   Multiple Vitamins-Minerals (MULTIVITAMIN WOMEN PO), Take by mouth daily., Disp: , Rfl:    phentermine  (ADIPEX-P ) 37.5 MG tablet, Take 1/2 to 1 tablet every Morninng for Dieting & Weight Loss                                                                    /                                                                   TAKE                                         BY                                                 MOUTH, Disp: 90 tablet, Rfl: 0   progesterone  (PROMETRIUM ) 100 MG capsule, Take by mouth., Disp: , Rfl:    testosterone (ANDROGEL) 50 MG/5GM (1%) GEL, Place 5 g onto the skin daily., Disp: , Rfl:    tretinoin  (RETIN-A ) 0.1 % cream, Apply at bedtime topically., Disp: 45 g, Rfl: 3   TURMERIC CURCUMIN PO, Take by mouth daily., Disp: , Rfl:    Vitamin D -Vitamin K (VITAMIN K2-VITAMIN D3 PO), Take by mouth daily., Disp: , Rfl:    levocetirizine (XYZAL ) 5 MG tablet, TAKE 1 TABLET BY MOUTH DAILY FOR ALLERGIES (Patient not taking: Reported on 04/03/2024), Disp: 90 tablet, Rfl: 3  Physical Exam:   BP (!) 146/89 (BP Location: Left Arm, Patient Position: Sitting, Cuff Size: Normal)   Pulse 74   Ht 5' 1 (1.549 m)   SpO2 98%   BMI 23.43 kg/m   Salient findings:  CN II-XII intact Bilateral EAC clear and TM intact  with well pneumatized middle ear spaces Anterior rhinoscopy: Septum intact; bilateral inferior turbinates without significant hypertrophy No lesions of oral cavity/oropharynx No obviously palpable neck masses/lymphadenopathy/thyromegaly --- area she points to appears to be her pulsating carotid, which is easily palpable given very thin neck; no  appreciable lymphadenopathy in neck No respiratory distress or stridor; easily tolerating secretions   Seprately Identifiable Procedures:  Prior to initiating any procedures, risks/benefits/alternatives were explained to the patient and verbal consent obtained. Procedure Note (Prior, not today) Pre-procedure diagnosis:  Dysphagia Post-procedure diagnosis: Same Procedure: Transnasal Fiberoptic Laryngoscopy, CPT 31575 - Mod 25 Indication: see above Complications: None apparent EBL: 0 mL  The procedure was undertaken to further evaluate the patient's complaint above, with mirror exam inadequate for appropriate examination due to gag reflex and poor patient tolerance  Procedure:  Patient was identified as correct patient. Verbal consent was obtained. The nose was sprayed with oxymetazoline and 4% lidocaine. The The flexible laryngoscope was passed through the nose to view the nasal cavity, pharynx (oropharynx, hypopharynx) and larynx.  The larynx was examined at rest and during multiple phonatory tasks. Documentation was obtained and reviewed with patient. The scope was removed. The patient tolerated the procedure well.  Findings: The nasal cavity and nasopharynx did not reveal any masses or lesions, mucosa appeared to be without obvious lesions. The tongue base, pharyngeal walls, piriform sinuses, vallecula, epiglottis and postcricoid region are normal in appearance without noted masses. The visualized portion of the subglottis and proximal trachea is widely patent. The vocal folds are mobile bilaterally. There are no lesions on the free edge of the vocal  folds nor elsewhere in the larynx worrisome for malignancy.    Electronically signed by: Eldora KATHEE Blanch, MD 04/03/2024 10:48 AM   Impression & Plans:  Leah Patrick is a 63 y.o. female with:  1. Sensorineural hearing loss, bilateral   2. Other dysphagia   3. Tinnitus of both ears    Noted neck mass appears to be just her carotid on left, which is slightly more prominent than right. Otherwise asymptomatic, and she was relieved about this. Thin neck and given exam is straightforward, will observe  Dysphagia: likely globus/dysmotility; MBS and Esophagram Tinnitus and SNHL: likely presbycusis; discussed lipoflavonoids, retraining and HA; she will think about this but will try flavonoids  See below regarding exact medications prescribed this encounter including dosages and route: No orders of the defined types were placed in this encounter.     Thank you for allowing me the opportunity to care for your patient. Please do not hesitate to contact me should you have any other questions.  Sincerely, Eldora Blanch, MD Otolaryngologist (ENT), [facility] Phone: [phone] Fax: [phone]  04/03/2024, 10:48 AM   MDM:  Level 4: 99214 Complexity/Problems addressed: mod - multiple chronic problems Data complexity: mod - independent test interpretation - Morbidity: low  - Prescription Drug prescribed or managed: n

## 2024-04-03 NOTE — Patient Instructions (Signed)
 Lipoflavonoid ---- use it for a month, if does not work, then stop.

## 2024-04-05 ENCOUNTER — Other Ambulatory Visit: Payer: Self-pay | Admitting: *Deleted

## 2024-04-05 DIAGNOSIS — M79606 Pain in leg, unspecified: Secondary | ICD-10-CM

## 2024-04-17 ENCOUNTER — Encounter: Payer: Self-pay | Admitting: Audiology

## 2024-04-26 NOTE — Progress Notes (Signed)
 "  Patient ID: Leah Patrick, female   DOB: [birth year], 64 y.o.   MRN: [id number]  Reason for Consult: New Patient (Initial Visit)   Referred by Aletha Bene, MD  Subjective:     HPI  Leah Patrick is a 65 y.o. female who presents for evaluation of lower extremity swelling and spider veins. Timeframe: Many years, has had RFA and sclerotherapy done at a vein center in 2023. Symptoms: Aching and heaviness typically in the evening Varicosities: No Previous wounds: No Previous DVT: No In compression: Inconsistently  Past Medical History:  Diagnosis Date   Allergy 1995   Seasonal   Complication of anesthesia    gets cold and the shakes when waking up   GERD (gastroesophageal reflux disease) 2010   Occasionally   Hypertension    white coat syndrome   Palpitations    Peripheral vascular disease    Poor sleep hygiene    Pre-diabetes    diet controlled meds for weight loss only   Family History  Problem Relation Age of Onset   GER disease Mother    Varicose Veins Mother    COPD Father    Lung cancer Father    Cancer Father    Heart disease Sister    Obesity Sister    Varicose Veins Sister    Stroke Sister    Varicose Veins Sister    Cancer Brother    Heart disease Brother    Breast cancer Neg Hx    Uterine cancer Neg Hx    Colon cancer Neg Hx    Ovarian cancer Neg Hx    Past Surgical History:  Procedure Laterality Date   DENTAL SURGERY N/A    EYE SURGERY  1974   Amblyopia - lazy eye 8th grade   SQUINT REPAIR Right 04/19/1972    Short Social History:  Social History   Tobacco Use   Smoking status: Never   Smokeless tobacco: Never  Substance Use Topics   Alcohol use: Yes    Alcohol/week: 3.0 standard drinks of alcohol    Types: 1 Glasses of wine, 1 Cans of beer, 1 Standard drinks or equivalent per week    Comment: Social drinks only - wine or beer with dinner out    Allergies[1]  Current Outpatient Medications  Medication Sig Dispense Refill    Ascorbic Acid (VITAMIN C) 1000 MG tablet Take 1,000 mg by mouth daily.     DIVIGEL  1 MG/GM GEL Apply daily 90 g 99   metFORMIN  (GLUCOPHAGE -XR) 500 MG 24 hr tablet Take 2 tablets 2 x /day with Meals for Diabetes. 360 tablet 3   Multiple Vitamins-Minerals (MULTIVITAMIN WOMEN PO) Take by mouth daily.     phentermine  (ADIPEX-P ) 37.5 MG tablet Take 1/2 to 1 tablet every Morninng for Dieting & Weight Loss                                                                    /  TAKE                                         BY                                                 MOUTH 90 tablet 0   progesterone  (PROMETRIUM ) 100 MG capsule Take by mouth.     testosterone (ANDROGEL) 50 MG/5GM (1%) GEL Place 5 g onto the skin daily.     tretinoin  (RETIN-A ) 0.1 % cream Apply at bedtime topically. 45 g 3   TURMERIC CURCUMIN PO Take by mouth daily.     Vitamin D -Vitamin K (VITAMIN K2-VITAMIN D3 PO) Take by mouth daily.     No current facility-administered medications for this visit.    REVIEW OF SYSTEMS All other systems were reviewed and are negative    Objective:  Objective   Vitals:   04/27/24 1440  BP: 139/76  Pulse: 67  Resp: 18  Temp: 98.1 F (36.7 C)  TempSrc: Temporal  SpO2: 99%  Weight: 128 lb 14.4 oz (58.5 kg)  Height: 5' 1 (1.549 m)   Body mass index is 24.36 kg/m.  Physical Exam General: no acute distress Cardiac: hemodynamically stable Extremities: 1+ edema bilaterally right slightly greater than left, few clusters of telangiectasias in the medial knee on the right and anterior shin on the left Vascular:   Right: palpable DP, PT  Left: palpable DP, PT   Data: Reflux study Right:  - No evidence of deep vein thrombosis seen in the right lower extremity,  from the common femoral through the popliteal veins.  - No evidence of superficial venous thrombosis in the right lower  extremity.  - No evidence of  superficial venous reflux seen in the right short  saphenous vein.  - Venous reflux is noted in the right greater saphenous vein in the calf.  - Successful ablation of the great saphenous vein from the distal thigh up  to 3.6 cm from the saphenofemoral junction.  - Avascular cystic structure in the popliteal fossa, with ultrasound  characteristics consistent with Baker's cyst.       Assessment/Plan:     Leah Patrick is a 64 y.o. female with chronic venous insufficiency with C3 disease  I explained the foundation of CVI treatment of compression and elevation I recommended medical grade graduated compression stockings and intermittent leg elevation We also discussed that many patients find symptom improvement with exercise   We also discussed that since she has had bilateral ablation there is unfortunately no other intervention to offer.  We did discuss that she needs to be more consistent with her compression and elevation. She was measured in the office today for new compression stockings. She is interested in speaking with [facility] about sclerotherapy  Follow up PRN      Norman GORMAN Serve MD [facility]     [1]  Allergies Allergen Reactions   Dtap-Ipv Vaccine Hives   Tetanus Antitoxin Hives   Flexeril [Cyclobenzaprine]    "

## 2024-04-27 ENCOUNTER — Ambulatory Visit (HOSPITAL_COMMUNITY)
Admission: RE | Admit: 2024-04-27 | Discharge: 2024-04-27 | Disposition: A | Source: Ambulatory Visit | Attending: Surgery | Admitting: Surgery

## 2024-04-27 ENCOUNTER — Encounter: Payer: Self-pay | Admitting: Vascular Surgery

## 2024-04-27 ENCOUNTER — Ambulatory Visit: Admitting: Vascular Surgery

## 2024-04-27 VITALS — BP 139/76 | HR 67 | Temp 98.1°F | Resp 18 | Ht 61.0 in | Wt 128.9 lb

## 2024-04-27 DIAGNOSIS — M79604 Pain in right leg: Secondary | ICD-10-CM | POA: Diagnosis not present

## 2024-04-27 DIAGNOSIS — I872 Venous insufficiency (chronic) (peripheral): Secondary | ICD-10-CM | POA: Diagnosis present

## 2024-04-27 DIAGNOSIS — M79606 Pain in leg, unspecified: Secondary | ICD-10-CM | POA: Diagnosis present

## 2024-04-27 DIAGNOSIS — I8393 Asymptomatic varicose veins of bilateral lower extremities: Secondary | ICD-10-CM

## 2024-04-27 DIAGNOSIS — M7989 Other specified soft tissue disorders: Secondary | ICD-10-CM

## 2024-04-30 ENCOUNTER — Telehealth: Payer: Self-pay

## 2024-04-30 NOTE — Telephone Encounter (Signed)
 Attempted to reach pt to discuss sclerotherapy after her MD visit last week. LVM for her to return our call at her convenience.

## 2024-05-03 ENCOUNTER — Telehealth: Payer: Self-pay

## 2024-05-03 NOTE — Telephone Encounter (Signed)
 Copied from CRM #[id number]. Topic: Clinical - Request for Lab/Test Order >> May 03, 2024  4:02 PM Delon DASEN wrote: Reason for CRM: Patient needs EKG and labs - cbc, cmp and thyroid  panel before surgery- [phone]  need this asap before Feb 17, they want it 5 weeks ahead of time >> May 03, 2024  4:20 PM Nurse Alexandro Bruin C wrote: Called pt and scheduled pt for a surgical clearance with provider for Monday May 08, 2023.

## 2024-05-07 ENCOUNTER — Encounter: Payer: Self-pay | Admitting: Family Medicine

## 2024-05-07 ENCOUNTER — Ambulatory Visit: Admitting: Family Medicine

## 2024-05-07 VITALS — BP 128/86 | HR 62 | Ht 61.0 in | Wt 129.2 lb

## 2024-05-07 DIAGNOSIS — Z01818 Encounter for other preprocedural examination: Secondary | ICD-10-CM | POA: Diagnosis not present

## 2024-05-07 NOTE — Progress Notes (Signed)
 "  Patient Office Visit  Assessment & Plan:  Preop examination -     CBC with Differential/Platelet -     Comprehensive metabolic panel with GFR -     Thyroid  Panel With TSH   Assessment and Plan    Preoperative evaluation for planned brow lift surgery Scheduled for brow lift surgery on February 17th. EKG normal. No respiratory or cardiac symptoms. Tolerates anesthesia with nausea. Allergic to tetanus vaccine. Hesitant about shingles vaccine. No flu shot this year. - Ordered CBC, CMP, and thyroid  function tests. - Advised against driving post-surgery due to anesthesia effects.      Will fax paperwork to her surgeon once we receive lab results.  EKG- NSR, no acute changes noted Return if symptoms worsen or fail to improve.   Subjective:    Patient ID: Leah Patrick, female    DOB: 08-16-1960  Age: 64 y.o. MRN: [id number]  Chief Complaint  Patient presents with   Pre-op Exam    Pt is having browlift with lower bleharoplasty on 2/17.     HPI Discussed the use of AI scribe software for clinical note transcription with the patient, who gave verbal consent to proceed.  History of Present Illness        History of Present Illness Leah Patrick is a 64 year old female who presents for pre-operative evaluation and blood work prior to a brow lift surgery that will be done at [facility]  She is scheduled for a brow lift surgery on February 17th. As part of her pre-operative workup, she has undergone an EKG, CBC, CFP, and thyroid  function tests. She has previously undergone general anesthesia and reports experiencing significant nausea without vomiting post-operatively.  No current issues with shortness of breath or chest pain. She has not received a flu shot this season and is allergic to the tetanus vaccine, which causes swelling. She has not received the shingles vaccine and is uncertain about her history of chickenpox.  During the holiday season, she did not maintain her usual diet and  exercise routine due to hosting multiple visitors from out of town, including guests from [employer]  and [employer]. She enjoys cooking for her guests, often preparing dishes like cheesecake.  Physical Exam CHEST: Lungs clear to auscultation bilaterally.  Results Diagnostic EKG: Normal (Independently interpreted)  Assessment and Plan Preoperative evaluation for planned brow lift surgery Scheduled for brow lift surgery on February 17th. EKG normal. No respiratory or cardiac symptoms. Tolerates anesthesia with nausea. Allergic to tetanus vaccine. Hesitant about shingles vaccine. No flu shot this year. - Ordered CBC, CMP, and thyroid  function tests. - Advised against driving post-surgery due to anesthesia effects.    The 10-year ASCVD risk score (Arnett DK, et al., 2019) is: 3%  Past Medical History:  Diagnosis Date   Allergy 1995   Seasonal   Complication of anesthesia    gets cold and the shakes when waking up   GERD (gastroesophageal reflux disease) 2010   Occasionally   Hypertension    white coat syndrome   Palpitations    Peripheral vascular disease    Poor sleep hygiene    Pre-diabetes    diet controlled meds for weight loss only   Past Surgical History:  Procedure Laterality Date   DENTAL SURGERY N/A    EYE SURGERY  1974   Amblyopia - lazy eye 8th grade   SQUINT REPAIR Right 04/19/1972   Social History[1] Family History  Problem Relation Age of Onset   GER  disease Mother    Varicose Veins Mother    COPD Father    Lung cancer Father    Cancer Father    Heart disease Sister    Obesity Sister    Varicose Veins Sister    Stroke Sister    Varicose Veins Sister    Cancer Brother    Heart disease Brother    Breast cancer Neg Hx    Uterine cancer Neg Hx    Colon cancer Neg Hx    Ovarian cancer Neg Hx    Allergies[2]  ROS    Objective:    BP 128/86   Pulse 62   Ht 5' 1 (1.549 m)   Wt 129 lb 4 oz (58.6 kg)   SpO2 99%   BMI 24.42 kg/m  BP Readings from  Last 3 Encounters:  05/07/24 128/86  04/27/24 139/76  04/03/24 (!) 146/89   Wt Readings from Last 3 Encounters:  05/07/24 129 lb 4 oz (58.6 kg)  04/27/24 128 lb 14.4 oz (58.5 kg)  03/05/24 124 lb (56.2 kg)    Physical Exam Vitals and nursing note reviewed.  Constitutional:      Appearance: Normal appearance.  HENT:     Head: Normocephalic.     Right Ear: Tympanic membrane, ear canal and external ear normal.     Left Ear: Tympanic membrane, ear canal and external ear normal.  Eyes:     Extraocular Movements: Extraocular movements intact.     Conjunctiva/sclera: Conjunctivae normal.     Pupils: Pupils are equal, round, and reactive to light.  Cardiovascular:     Rate and Rhythm: Normal rate and regular rhythm.     Heart sounds: Normal heart sounds.  Pulmonary:     Effort: Pulmonary effort is normal.     Breath sounds: Normal breath sounds.  Musculoskeletal:     Right lower leg: No edema.     Left lower leg: No edema.  Neurological:     General: No focal deficit present.     Mental Status: She is alert and oriented to person, place, and time.  Psychiatric:        Mood and Affect: Mood normal.        Behavior: Behavior normal.        Thought Content: Thought content normal.        Judgment: Judgment normal.      No results found for any visits on 05/07/24.           [1]  Social History Tobacco Use   Smoking status: Never   Smokeless tobacco: Never  Vaping Use   Vaping status: Never Used  Substance Use Topics   Alcohol use: Yes    Alcohol/week: 3.0 standard drinks of alcohol    Types: 1 Glasses of wine, 1 Cans of beer, 1 Standard drinks or equivalent per week    Comment: Social drinks only - wine or beer with dinner out   Drug use: Never  [2]  Allergies Allergen Reactions   Dtap-Ipv Vaccine Hives   Tetanus Antitoxin Hives   Flexeril [Cyclobenzaprine]    "

## 2024-05-08 ENCOUNTER — Ambulatory Visit: Payer: Self-pay | Admitting: Family Medicine

## 2024-05-08 LAB — COMPREHENSIVE METABOLIC PANEL WITH GFR
AG Ratio: 2.4 (calc) (ref 1.0–2.5)
ALT: 13 U/L (ref 6–29)
AST: 20 U/L (ref 10–35)
Albumin: 4.7 g/dL (ref 3.6–5.1)
Alkaline phosphatase (APISO): 43 U/L (ref 37–153)
BUN: 12 mg/dL (ref 7–25)
CO2: 26 mmol/L (ref 20–32)
Calcium: 9.4 mg/dL (ref 8.6–10.4)
Chloride: 107 mmol/L (ref 98–110)
Creat: 0.65 mg/dL (ref 0.50–1.05)
Globulin: 2 g/dL (ref 1.9–3.7)
Glucose, Bld: 81 mg/dL (ref 65–99)
Potassium: 4.4 mmol/L (ref 3.5–5.3)
Sodium: 140 mmol/L (ref 135–146)
Total Bilirubin: 0.5 mg/dL (ref 0.2–1.2)
Total Protein: 6.7 g/dL (ref 6.1–8.1)
eGFR: 99 mL/min/1.73m2

## 2024-05-08 LAB — CBC WITH DIFFERENTIAL/PLATELET
Absolute Lymphocytes: 1725 {cells}/uL (ref 850–3900)
Absolute Monocytes: 571 {cells}/uL (ref 200–950)
Basophils Absolute: 28 {cells}/uL (ref 0–200)
Basophils Relative: 0.5 %
Eosinophils Absolute: 67 {cells}/uL (ref 15–500)
Eosinophils Relative: 1.2 %
HCT: 43.3 % (ref 35.9–46.0)
Hemoglobin: 14.1 g/dL (ref 11.7–15.5)
MCH: 29.8 pg (ref 27.0–33.0)
MCHC: 32.6 g/dL (ref 31.6–35.4)
MCV: 91.5 fL (ref 81.4–101.7)
MPV: 9.8 fL (ref 7.5–12.5)
Monocytes Relative: 10.2 %
Neutro Abs: 3209 {cells}/uL (ref 1500–7800)
Neutrophils Relative %: 57.3 %
Platelets: 255 Thousand/uL (ref 140–400)
RBC: 4.73 Million/uL (ref 3.80–5.10)
RDW: 12.6 % (ref 11.0–15.0)
Total Lymphocyte: 30.8 %
WBC: 5.6 Thousand/uL (ref 3.8–10.8)

## 2024-05-08 LAB — THYROID PANEL WITH TSH
Free Thyroxine Index: 2.2 (ref 1.4–3.8)
T3 Uptake: 30 % (ref 22–35)
T4, Total: 7.4 ug/dL (ref 5.1–11.9)
TSH: 2.08 m[IU]/L (ref 0.40–4.50)

## 2024-05-11 ENCOUNTER — Encounter: Payer: Self-pay | Admitting: Family Medicine

## 2024-05-30 ENCOUNTER — Ambulatory Visit

## 2024-07-18 ENCOUNTER — Encounter: Admitting: Family Medicine

## 2024-07-25 ENCOUNTER — Encounter: Admitting: Family Medicine
